# Patient Record
Sex: Male | Born: 1965 | Race: White | Hispanic: No | Marital: Married | Smoking: Never smoker
Health system: Southern US, Community
[De-identification: ages and names within clinical notes are randomized; demographics above are authoritative.]

## PROBLEM LIST (undated history)

## (undated) DIAGNOSIS — G4730 Sleep apnea, unspecified: Secondary | ICD-10-CM

## (undated) DIAGNOSIS — F419 Anxiety disorder, unspecified: Secondary | ICD-10-CM

## (undated) DIAGNOSIS — F1011 Alcohol abuse, in remission: Secondary | ICD-10-CM

## (undated) DIAGNOSIS — E7800 Pure hypercholesterolemia, unspecified: Secondary | ICD-10-CM

## (undated) DIAGNOSIS — E039 Hypothyroidism, unspecified: Secondary | ICD-10-CM

## (undated) DIAGNOSIS — K219 Gastro-esophageal reflux disease without esophagitis: Secondary | ICD-10-CM

## (undated) DIAGNOSIS — M1990 Unspecified osteoarthritis, unspecified site: Secondary | ICD-10-CM

## (undated) DIAGNOSIS — I10 Essential (primary) hypertension: Secondary | ICD-10-CM

## (undated) HISTORY — PX: ABDOMINAL SURGERY: SHX537

## (undated) HISTORY — DX: Hypothyroidism, unspecified: E03.9

## (undated) HISTORY — DX: Alcohol abuse, in remission: F10.11

## (undated) HISTORY — PX: TONGUE BIOPSY: SHX1075

## (undated) HISTORY — PX: ANKLE SURGERY: SHX546

---

## 2007-10-19 HISTORY — PX: OTHER SURGICAL HISTORY: SHX169

## 2007-10-19 HISTORY — PX: COLONOSCOPY: SHX174

## 2008-03-21 HISTORY — PX: COLONOSCOPY WITH ESOPHAGOGASTRODUODENOSCOPY (EGD): SHX5779

## 2008-04-17 HISTORY — PX: OTHER SURGICAL HISTORY: SHX169

## 2013-02-25 ENCOUNTER — Emergency Department (HOSPITAL_COMMUNITY): Payer: BC Managed Care – PPO

## 2013-02-25 ENCOUNTER — Emergency Department (HOSPITAL_COMMUNITY)
Admission: EM | Admit: 2013-02-25 | Discharge: 2013-02-25 | Disposition: A | Discharge status: Home / Self Care | Payer: BC Managed Care – PPO | Attending: Emergency Medicine | Admitting: Emergency Medicine

## 2013-02-25 ENCOUNTER — Encounter (HOSPITAL_COMMUNITY): Payer: Self-pay | Admitting: *Deleted

## 2013-02-25 DIAGNOSIS — IMO0002 Reserved for concepts with insufficient information to code with codable children: Secondary | ICD-10-CM | POA: Insufficient documentation

## 2013-02-25 DIAGNOSIS — M25519 Pain in unspecified shoulder: Secondary | ICD-10-CM | POA: Insufficient documentation

## 2013-02-25 DIAGNOSIS — M792 Neuralgia and neuritis, unspecified: Secondary | ICD-10-CM

## 2013-02-25 DIAGNOSIS — Z79899 Other long term (current) drug therapy: Secondary | ICD-10-CM | POA: Insufficient documentation

## 2013-02-25 DIAGNOSIS — E7800 Pure hypercholesterolemia, unspecified: Secondary | ICD-10-CM | POA: Insufficient documentation

## 2013-02-25 DIAGNOSIS — M5412 Radiculopathy, cervical region: Secondary | ICD-10-CM | POA: Insufficient documentation

## 2013-02-25 DIAGNOSIS — Z7982 Long term (current) use of aspirin: Secondary | ICD-10-CM | POA: Insufficient documentation

## 2013-02-25 HISTORY — DX: Pure hypercholesterolemia, unspecified: E78.00

## 2013-02-25 IMAGING — CR DG CERVICAL SPINE COMPLETE 4+V
6 series · 6 of 6 positions shown · non-contrast
Comparison: None.

CLINICAL DATA: Right-sided arm numbness for 3 days.  No injury.

CERVICAL SPINE - COMPLETE 4+ VIEW

[view not recorded (1 of 6)]
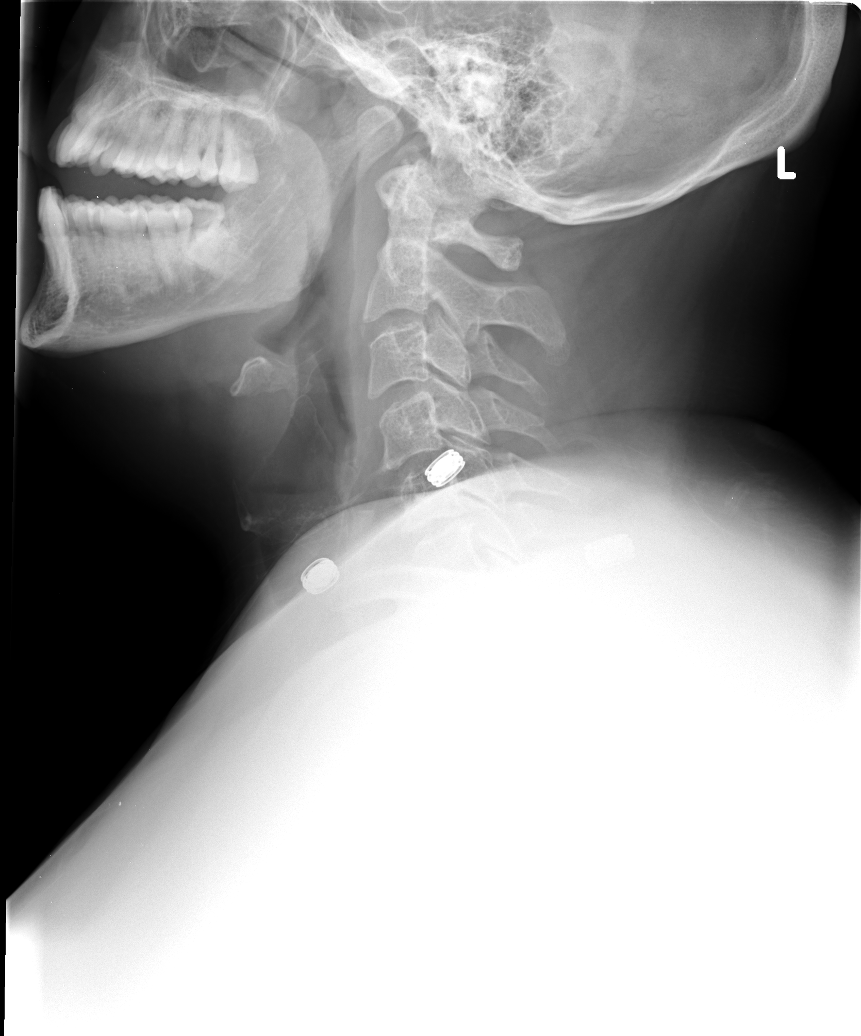

[view not recorded (2 of 6)]
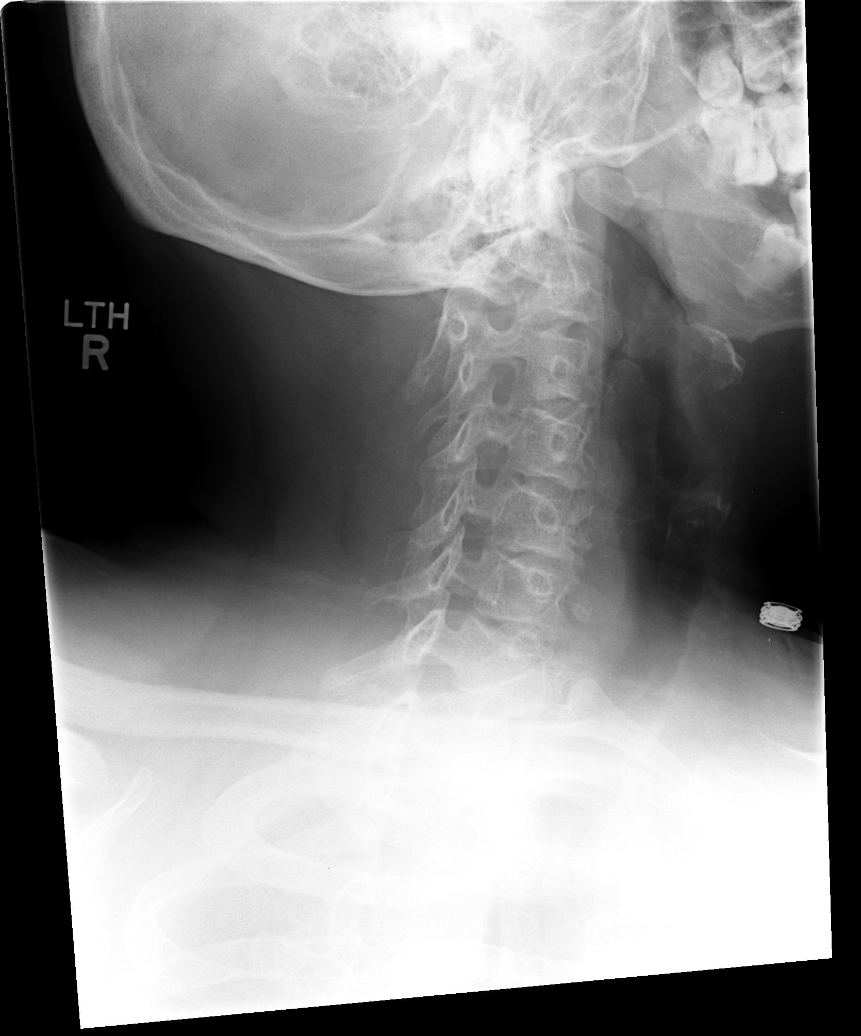

[view not recorded (3 of 6)]
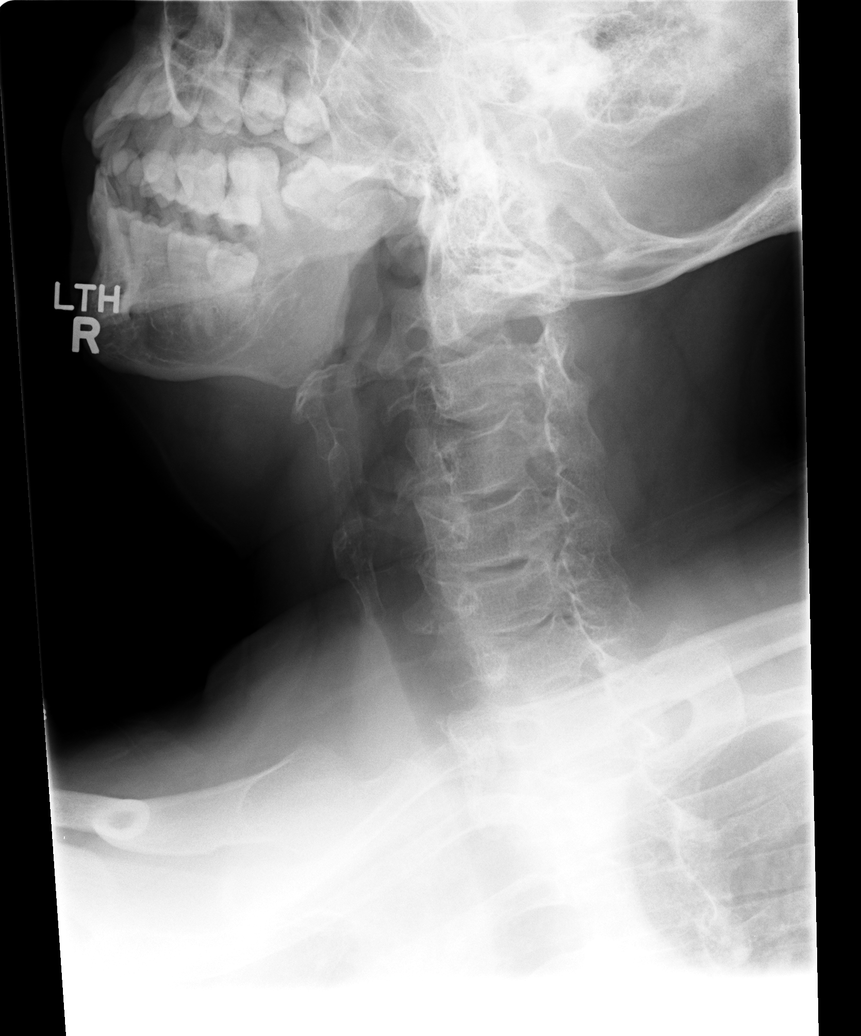

[view not recorded (4 of 6)]
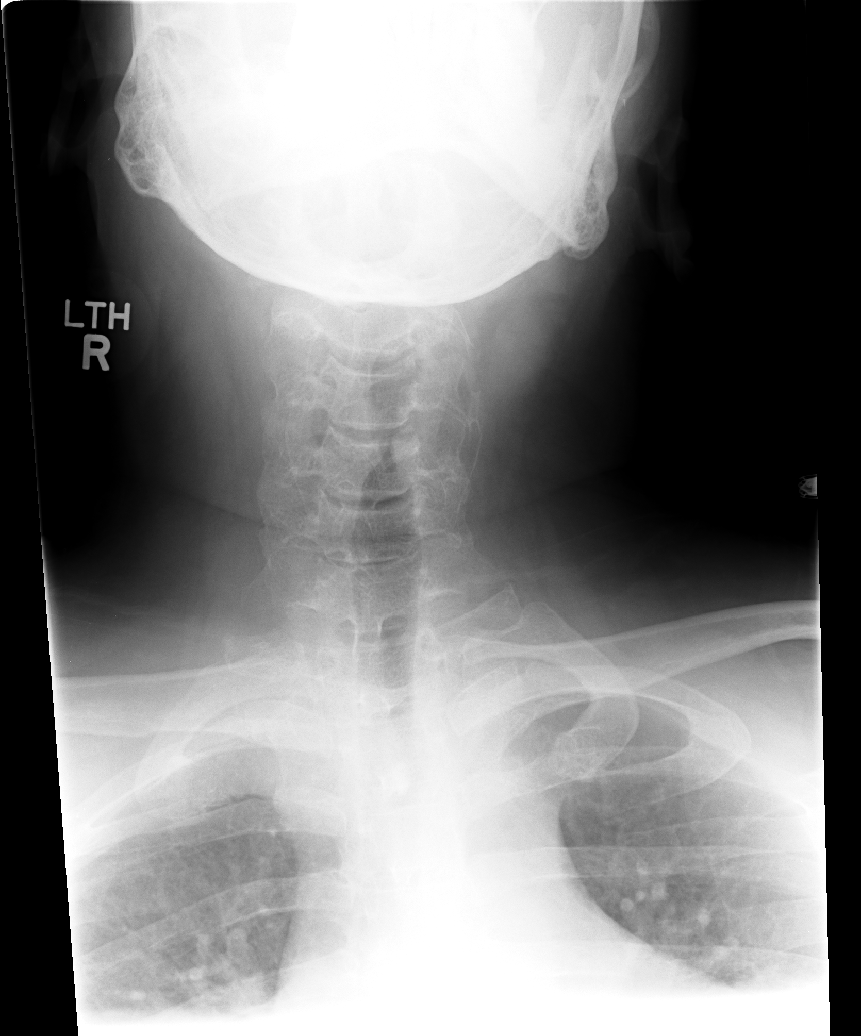

[view not recorded (5 of 6)]
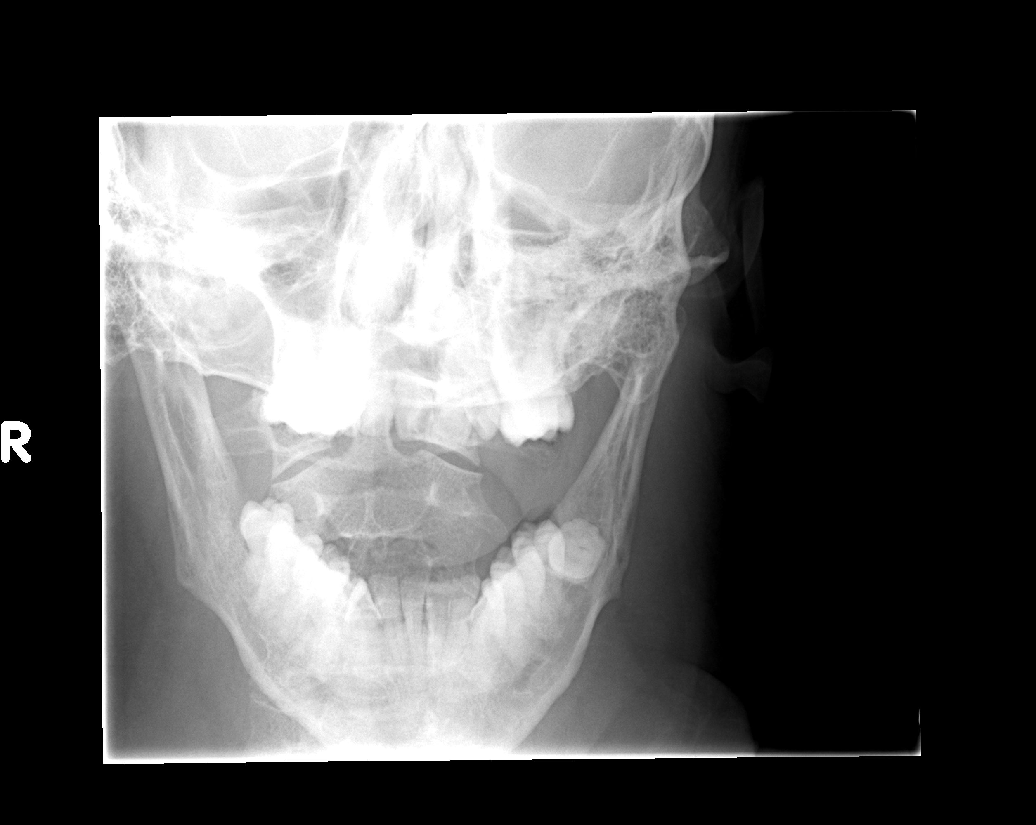

[view not recorded (6 of 6)]
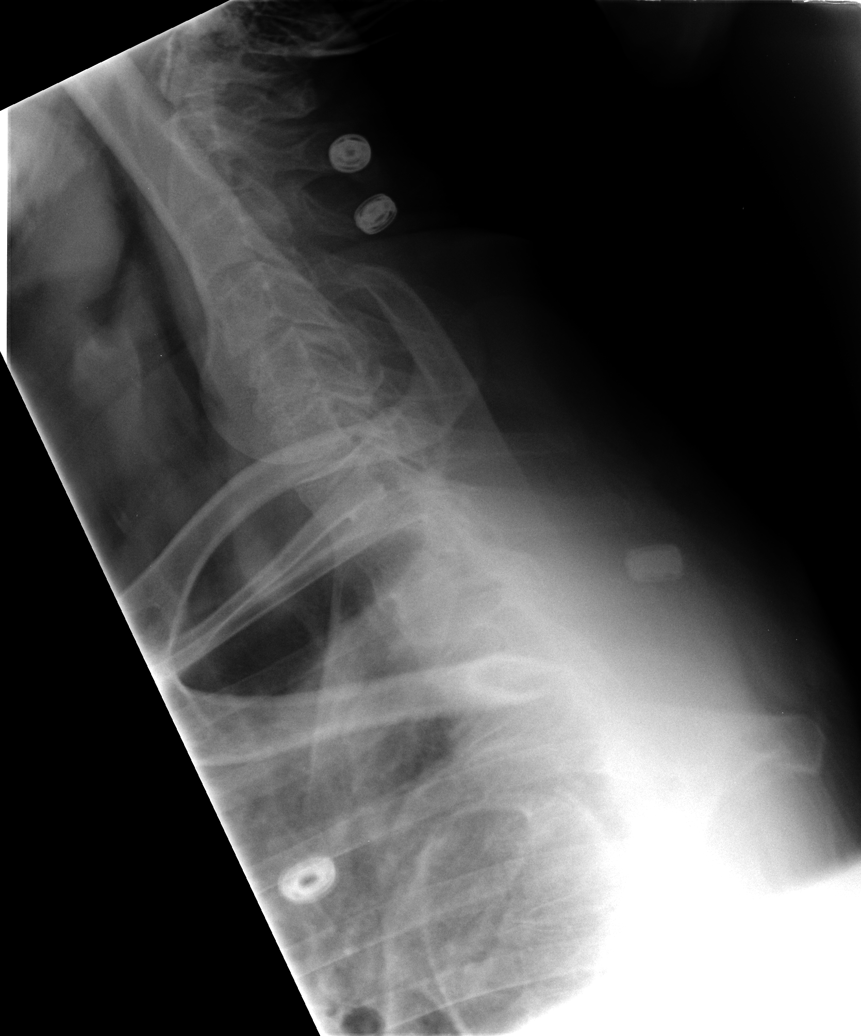

[6 of 6 positions shown; findings below may reference images not displayed]

FINDINGS: Suboptimal visualization of the cervical spine due to
body habitus.  On the lateral view, cervical spine is only
visualized C5-C6.

T5-C6 shows disc space narrowing with posterior osteophytic
spurring.  Prevertebral soft tissues grossly appear within normal
limits.

There is no fracture.  Suboptimal oblique view the left neural
foramina.  The odontoid appears intact.  No fractures identified on
the frontal view.  On the lateral swimmer's view, the cervical
spine is visualized to the cervicothoracic junction and the
alignment appears within normal limits.  On the open-mouth odontoid
view, the head is rotated to the left.
IMPRESSION: C5-C6 predominant cervical spondylosis.  No acute osseous
abnormality.

## 2013-02-25 MED ORDER — OXYCODONE-ACETAMINOPHEN 5-325 MG PO TABS
1.0000 | ORAL_TABLET | Freq: Once | ORAL | Status: AC
  Administered 2013-02-25: 1 via ORAL
  Filled 2013-02-25: qty 1

## 2013-02-25 MED ORDER — HYDROCODONE-ACETAMINOPHEN 5-325 MG PO TABS: 1.0000 | ORAL_TABLET | Freq: Four times a day (QID) | ORAL | Status: DC | PRN

## 2013-02-25 MED ORDER — PREDNISONE 10 MG PO TABS: 20.0000 mg | ORAL_TABLET | Freq: Every day | ORAL | Status: DC

## 2013-02-25 NOTE — ED Notes (Addendum)
Pt c/o pain that starts at neck area and radiates down right arm, numbness and tingling to right hand and fingers, reports that he has weakness to right arm and hand when he tries to pick things up and use that arm, pain and tingling is worse with certain head positions. Symptoms started suddenly Friday, denies any injury.

## 2013-02-25 NOTE — ED Provider Notes (Signed)
History     CSN: [id number]  Arrival date & time 02/25/13  1559   First MD Initiated Contact with Patient 02/25/13 1655      Chief Complaint  Patient presents with  . Numbness    (Consider location/radiation/quality/duration/timing/severity/associated sxs/prior treatment) Patient is a 47 y.o. male presenting with shoulder pain. The history is provided by the patient (pt complains of neck pain radiating down right arm).  Shoulder Pain This is a new problem. The current episode started more than 2 days ago. The problem occurs constantly. The problem has not changed since onset.Pertinent negatives include no chest pain, no abdominal pain and no headaches. Nothing aggravates the symptoms. Nothing relieves the symptoms.    Past Medical History  Diagnosis Date  . High cholesterol     Past Surgical History  Procedure Laterality Date  . Abdominal surgery    . Ankle surgery      No family history on file.  History  Substance Use Topics  . Smoking status: Never Smoker   . Smokeless tobacco: Not on file  . Alcohol Use: Yes      Review of Systems  Constitutional: Negative for appetite change and fatigue.  HENT: Negative for congestion, sinus pressure and ear discharge.   Eyes: Negative for discharge.  Respiratory: Negative for cough.   Cardiovascular: Negative for chest pain.  Gastrointestinal: Negative for abdominal pain and diarrhea.  Genitourinary: Negative for frequency and hematuria.  Musculoskeletal: Negative for back pain.       Pain right arm  Skin: Negative for rash.  Neurological: Negative for seizures and headaches.  Psychiatric/Behavioral: Negative for hallucinations.    Allergies  Review of patient's allergies indicates no known allergies.  Home Medications   Current Outpatient Rx  Name  Route  Sig  Dispense  Refill  . Armodafinil (NUVIGIL) 150 MG tablet   Oral   Take 150 mg by mouth daily.         Marland Kitchen aspirin EC 81 MG tablet   Oral   Take 81 mg  by mouth daily.         . fenofibrate 160 MG tablet   Oral   Take 160 mg by mouth daily.         . fish oil-omega-3 fatty acids 1000 MG capsule   Oral   Take 2 g by mouth 2 (two) times daily.         . fluticasone (FLONASE) 50 MCG/ACT nasal spray   Nasal   Place 2 sprays into the nose daily.         . multivitamin (ONE-A-DAY MEN'S) TABS   Oral   Take 1 tablet by mouth daily.         . pravastatin (PRAVACHOL) 40 MG tablet   Oral   Take 80 mg by mouth daily.         . sertraline (ZOLOFT) 50 MG tablet   Oral   Take 50 mg by mouth daily.         Marland Kitchen HYDROcodone-acetaminophen (NORCO/VICODIN) 5-325 MG per tablet   Oral   Take 1 tablet by mouth every 6 (six) hours as needed for pain.   20 tablet   0   . predniSONE (DELTASONE) 10 MG tablet   Oral   Take 2 tablets (20 mg total) by mouth daily.   14 tablet   0     BP 124/84  Pulse 82  Temp(Src) 98.1 F (36.7 C) (Oral)  Resp 16  Ht 5'  6" (1.676 m)  Wt 236 lb (107.049 kg)  BMI 38.11 kg/m2  SpO2 96%  Physical Exam  Constitutional: He is oriented to person, place, and time. He appears well-developed.  HENT:  Head: Normocephalic.  Eyes: Conjunctivae and EOM are normal. No scleral icterus.  Neck: Neck supple. No thyromegaly present.  Cardiovascular: Normal rate and regular rhythm.  Exam reveals no gallop and no friction rub.   No murmur heard. Pulmonary/Chest: No stridor. He has no wheezes. He has no rales. He exhibits no tenderness.  Abdominal: He exhibits no distension. There is no tenderness. There is no rebound.  Musculoskeletal: Normal range of motion. He exhibits no edema.  Lymphadenopathy:    He has no cervical adenopathy.  Neurological: He is oriented to person, place, and time. No cranial nerve deficit. Coordination normal.  Reflexes normal.    Skin: No rash noted. No erythema.  Psychiatric: He has a normal mood and affect. His behavior is normal.    ED Course  Procedures (including critical  care time)  Labs Reviewed - No data to display Dg Cervical Spine Complete  02/25/2013  *RADIOLOGY REPORT*  Clinical Data: Right-sided arm numbness for 3 days.  No injury.  CERVICAL SPINE - COMPLETE 4+ VIEW  Comparison: None.  Findings: Suboptimal visualization of the cervical spine due to body habitus.  On the lateral view, cervical spine is only visualized C5-C6.  T5-C6 shows disc space narrowing with posterior osteophytic spurring.  Prevertebral soft tissues grossly appear within normal limits.  There is no fracture.  Suboptimal oblique view the left neural foramina.  The odontoid appears intact.  No fractures identified on the frontal view.  On the lateral swimmer's view, the cervical spine is visualized to the cervicothoracic junction and the alignment appears within normal limits.  On the open-mouth odontoid view, the head is rotated to the left.  IMPRESSION: C5-C6 predominant cervical spondylosis.  No acute osseous abnormality.   Original Report Authenticated By: Andreas Newport, M.D.      1. Radicular pain in right arm       MDM          Benny Lennert, MD 02/25/13 1842

## 2016-09-03 ENCOUNTER — Encounter (HOSPITAL_COMMUNITY): Payer: Self-pay

## 2016-09-03 ENCOUNTER — Emergency Department (HOSPITAL_COMMUNITY)
Admission: EM | Admit: 2016-09-03 | Discharge: 2016-09-04 | Disposition: A | Discharge status: Home / Self Care | Payer: BLUE CROSS/BLUE SHIELD | Attending: Emergency Medicine | Admitting: Emergency Medicine

## 2016-09-03 DIAGNOSIS — R531 Weakness: Secondary | ICD-10-CM | POA: Diagnosis present

## 2016-09-03 DIAGNOSIS — R1084 Generalized abdominal pain: Secondary | ICD-10-CM | POA: Insufficient documentation

## 2016-09-03 DIAGNOSIS — Z7952 Long term (current) use of systemic steroids: Secondary | ICD-10-CM | POA: Diagnosis not present

## 2016-09-03 DIAGNOSIS — R197 Diarrhea, unspecified: Secondary | ICD-10-CM | POA: Insufficient documentation

## 2016-09-03 DIAGNOSIS — Z7982 Long term (current) use of aspirin: Secondary | ICD-10-CM | POA: Diagnosis not present

## 2016-09-03 DIAGNOSIS — R5383 Other fatigue: Secondary | ICD-10-CM | POA: Diagnosis not present

## 2016-09-03 DIAGNOSIS — Z79899 Other long term (current) drug therapy: Secondary | ICD-10-CM | POA: Diagnosis not present

## 2016-09-03 DIAGNOSIS — I10 Essential (primary) hypertension: Secondary | ICD-10-CM | POA: Insufficient documentation

## 2016-09-03 DIAGNOSIS — R110 Nausea: Secondary | ICD-10-CM | POA: Diagnosis not present

## 2016-09-03 HISTORY — DX: Sleep apnea, unspecified: G47.30

## 2016-09-03 HISTORY — DX: Essential (primary) hypertension: I10

## 2016-09-03 LAB — URINALYSIS, ROUTINE W REFLEX MICROSCOPIC
Bilirubin Urine: NEGATIVE
Glucose, UA: NEGATIVE mg/dL
Hgb urine dipstick: NEGATIVE
Ketones, ur: NEGATIVE mg/dL
LEUKOCYTES UA: NEGATIVE
NITRITE: NEGATIVE
PH: 6 (ref 5.0–8.0)
Protein, ur: NEGATIVE mg/dL

## 2016-09-03 LAB — COMPREHENSIVE METABOLIC PANEL
ALBUMIN: 3.9 g/dL (ref 3.5–5.0)
ALT: 62 U/L (ref 17–63)
ANION GAP: 8 (ref 5–15)
AST: 65 U/L — ABNORMAL HIGH (ref 15–41)
Alkaline Phosphatase: 51 U/L (ref 38–126)
BILIRUBIN TOTAL: 0.5 mg/dL (ref 0.3–1.2)
BUN: 5 mg/dL — ABNORMAL LOW (ref 6–20)
CO2: 26 mmol/L (ref 22–32)
Calcium: 9.6 mg/dL (ref 8.9–10.3)
Chloride: 100 mmol/L — ABNORMAL LOW (ref 101–111)
Creatinine, Ser: 0.99 mg/dL (ref 0.61–1.24)
GFR calc Af Amer: >60 mL/min (ref >60)
GFR calc non Af Amer: >60 mL/min (ref >60)
GLUCOSE: 92 mg/dL (ref 65–99)
POTASSIUM: 3.9 mmol/L (ref 3.5–5.1)
SODIUM: 134 mmol/L — AB (ref 135–145)
TOTAL PROTEIN: 7.7 g/dL (ref 6.5–8.1)

## 2016-09-03 LAB — CBC WITH DIFFERENTIAL/PLATELET
BASOS ABS: 0 10*3/uL (ref 0.0–0.1)
BASOS PCT: 0 %
Eosinophils Absolute: 0.2 10*3/uL (ref 0.0–0.7)
Eosinophils Relative: 2 %
HEMATOCRIT: 41.9 % (ref 39.0–52.0)
HEMOGLOBIN: 14.1 g/dL (ref 13.0–17.0)
Lymphocytes Relative: 18 %
Lymphs Abs: 2.3 10*3/uL (ref 0.7–4.0)
MCH: 28.8 pg (ref 26.0–34.0)
MCHC: 33.7 g/dL (ref 30.0–36.0)
MCV: 85.5 fL (ref 78.0–100.0)
MONOS PCT: 6 %
Monocytes Absolute: 0.8 10*3/uL (ref 0.1–1.0)
NEUTROS ABS: 9.7 10*3/uL — AB (ref 1.7–7.7)
NEUTROS PCT: 74 %
Platelets: 488 10*3/uL — ABNORMAL HIGH (ref 150–400)
RBC: 4.9 MIL/uL (ref 4.22–5.81)
RDW: 13.5 % (ref 11.5–15.5)
WBC: 13 10*3/uL — ABNORMAL HIGH (ref 4.0–10.5)

## 2016-09-03 LAB — I-STAT CG4 LACTIC ACID, ED: Lactic Acid, Venous: 1.22 mmol/L (ref 0.5–1.9)

## 2016-09-03 LAB — LIPASE, BLOOD: Lipase: 25 U/L (ref 11–51)

## 2016-09-03 MED ORDER — SODIUM CHLORIDE 0.9 % IV BOLUS (SEPSIS)
1000.0000 mL | Freq: Once | INTRAVENOUS | Status: AC
  Administered 2016-09-04: 1000 mL via INTRAVENOUS

## 2016-09-03 MED ORDER — ONDANSETRON HCL 4 MG/2ML IJ SOLN
4.0000 mg | Freq: Once | INTRAMUSCULAR | Status: AC
  Administered 2016-09-04: 4 mg via INTRAVENOUS
  Filled 2016-09-03: qty 2

## 2016-09-03 MED ORDER — SODIUM CHLORIDE 0.9 % IV BOLUS (SEPSIS)
1000.0000 mL | Freq: Once | INTRAVENOUS | Status: AC
  Administered 2016-09-03: 1000 mL via INTRAVENOUS

## 2016-09-03 NOTE — ED Notes (Signed)
Spouse states that pt has not had a drink since January "when he got his license back".
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
## 2016-09-03 NOTE — ED Notes (Signed)
Physician in to assess 

## 2016-09-03 NOTE — ED Notes (Signed)
  Pt states he has had D for the last 2 weeks- except last night when his stools were "mushy" per his report.He has a physician in [place] from where he traveled- Dr Jon Gillsodriquez who he has not seen. He reports 2 loose stools today- and last urinated giving a UA

## 2016-09-03 NOTE — ED Notes (Signed)
Pt reports that he has loose stools after he eats-

## 2016-09-03 NOTE — ED Notes (Signed)
Sprite to pt for pos

## 2016-09-03 NOTE — ED Triage Notes (Signed)
I have diarrhea, started two weeks ago.  Feeling real weak, having stomach cramps, and my head keeps hurting.  I was having nausea and vomiting, when it first started, now just having nausea.  Has lost a lot of weight (17 lbs) in the past two weeks.  Patient is able to eat and drink, but after he eats he has diarrhea.

## 2016-09-04 ENCOUNTER — Emergency Department (HOSPITAL_COMMUNITY)
Admission: EM | Admit: 2016-09-04 | Discharge: 2016-09-05 | Disposition: A | Discharge status: Home / Self Care | Payer: BLUE CROSS/BLUE SHIELD | Source: Home / Self Care | Attending: Emergency Medicine | Admitting: Emergency Medicine

## 2016-09-04 ENCOUNTER — Emergency Department (HOSPITAL_COMMUNITY): Payer: BLUE CROSS/BLUE SHIELD

## 2016-09-04 ENCOUNTER — Encounter (HOSPITAL_COMMUNITY): Payer: Self-pay | Admitting: Emergency Medicine

## 2016-09-04 DIAGNOSIS — Z7982 Long term (current) use of aspirin: Secondary | ICD-10-CM | POA: Insufficient documentation

## 2016-09-04 DIAGNOSIS — R197 Diarrhea, unspecified: Secondary | ICD-10-CM

## 2016-09-04 DIAGNOSIS — R112 Nausea with vomiting, unspecified: Secondary | ICD-10-CM | POA: Insufficient documentation

## 2016-09-04 DIAGNOSIS — I10 Essential (primary) hypertension: Secondary | ICD-10-CM

## 2016-09-04 DIAGNOSIS — R531 Weakness: Secondary | ICD-10-CM

## 2016-09-04 DIAGNOSIS — R109 Unspecified abdominal pain: Secondary | ICD-10-CM | POA: Insufficient documentation

## 2016-09-04 DIAGNOSIS — Z79899 Other long term (current) drug therapy: Secondary | ICD-10-CM

## 2016-09-04 IMAGING — CT CT ABD-PELV W/ CM
2 of 5 series · 17 of 46 positions shown, 19 images · IV contrast (APPLIED)
Comparison: None.

CLINICAL DATA: Constant diarrhea for 2 weeks

EXAM:
CT ABDOMEN AND PELVIS WITH CONTRAST
TECHNIQUE: Multidetector CT imaging of the abdomen and pelvis was performed
using the standard protocol following bolus administration of
intravenous contrast.
CONTRAST:  100 mL [74] intravenous. Enteric contrast was also
administered.

[Series 2: axial st · axial · 0.98mm/px · z∈[-852,-412]mm · 14 of 100 slices shown, 16 images]
[im 6/100  soft-tissue]
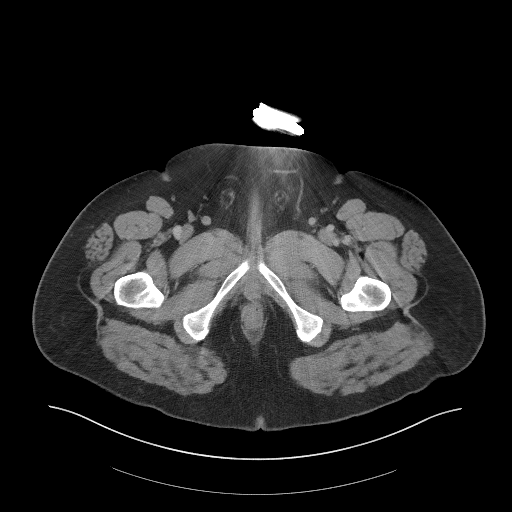
[im 6/100  bone]
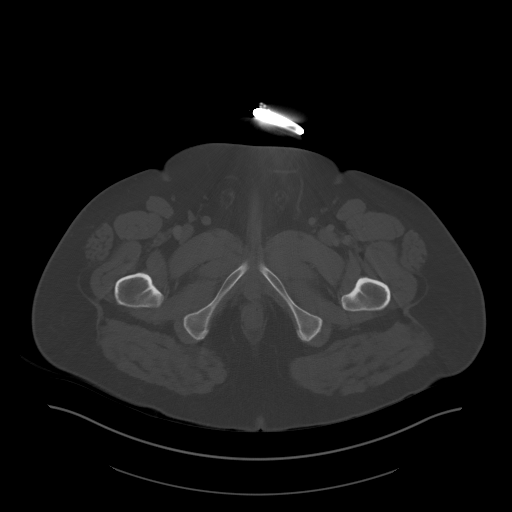
[im 12/100  soft-tissue]
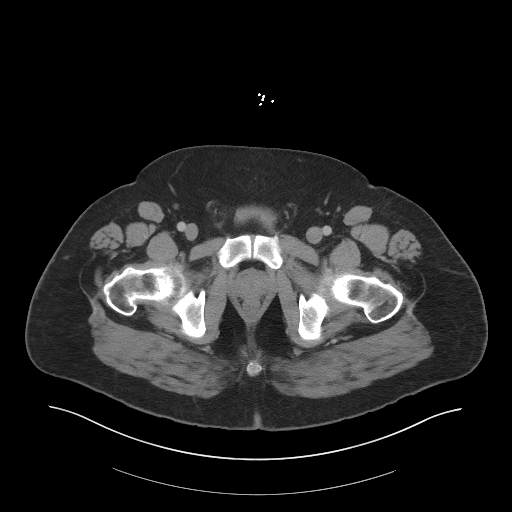
[im 18/100  soft-tissue]
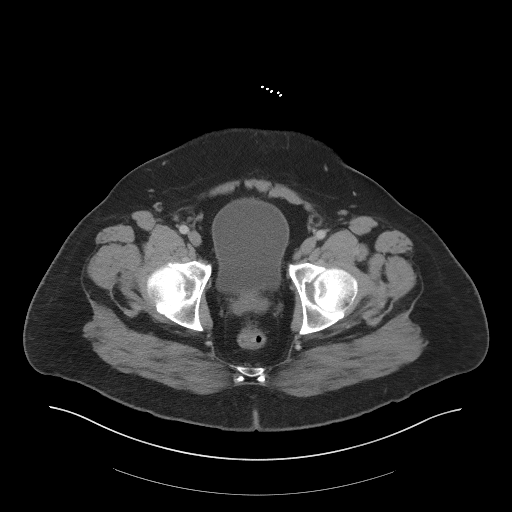
[im 30/100  soft-tissue]
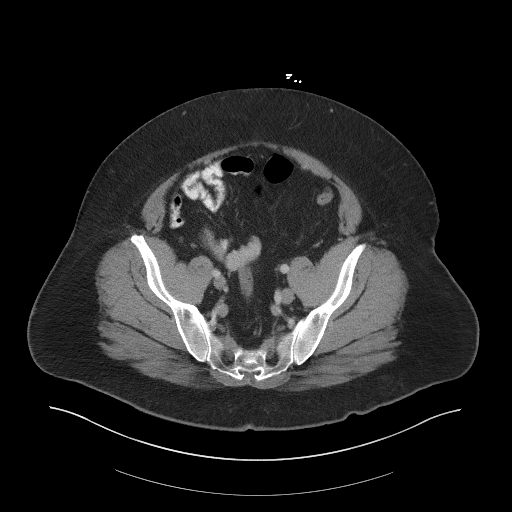
[im 35/100  soft-tissue]
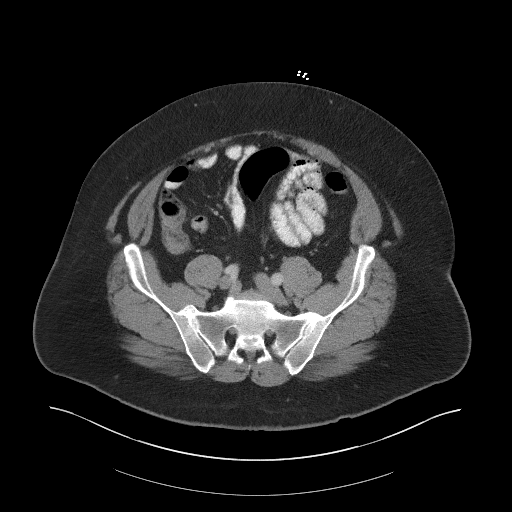
[im 41/100  soft-tissue]
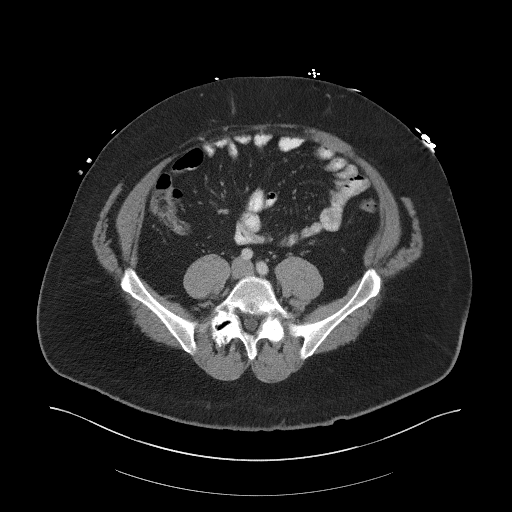
[im 47/100  soft-tissue]
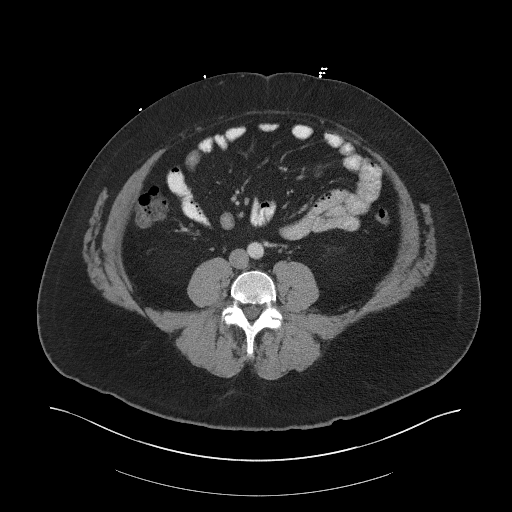
[im 53/100  soft-tissue]
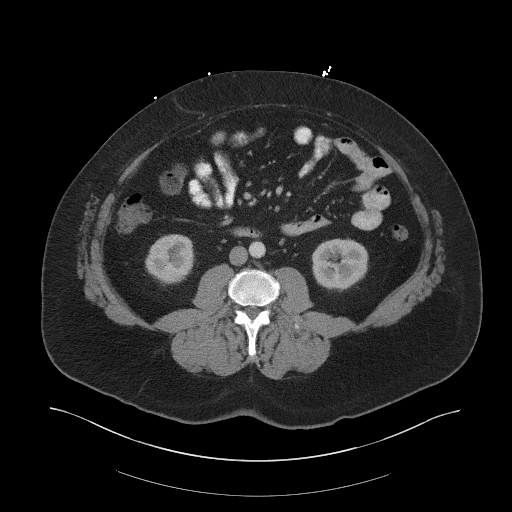
[im 59/100  soft-tissue]
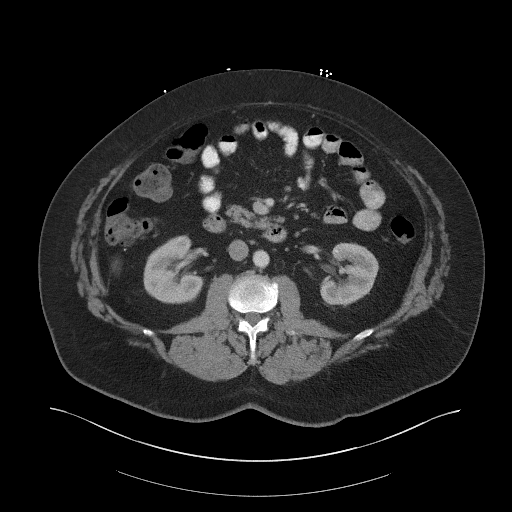
[im 59/100  bone]
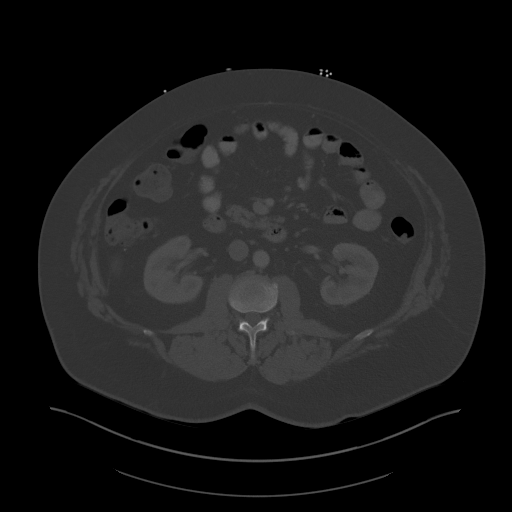
[im 65/100  soft-tissue]
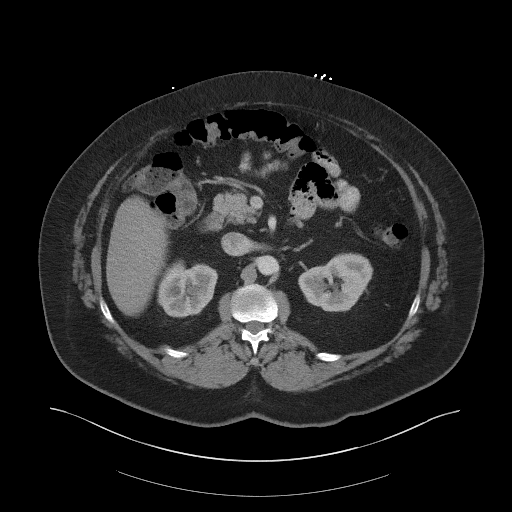
[im 76/100  soft-tissue]
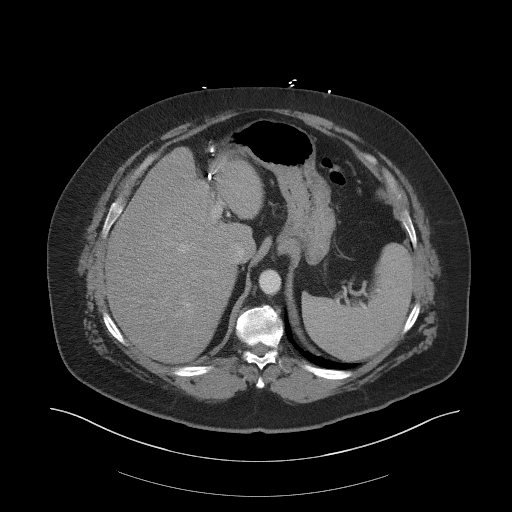
[im 82/100  soft-tissue]
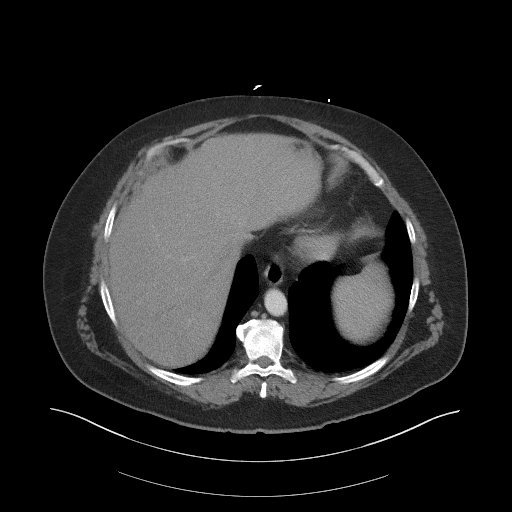
[im 88/100  soft-tissue]
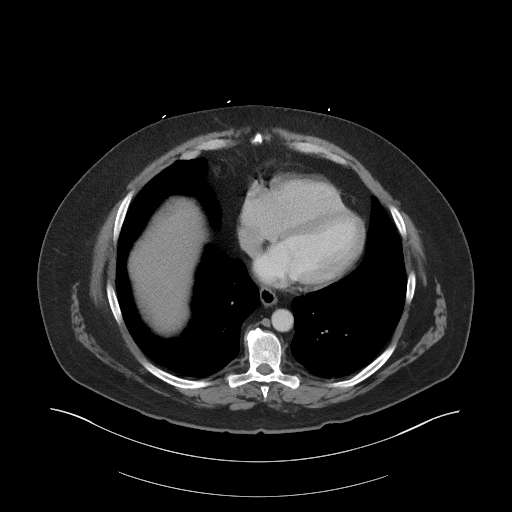
[im 94/100  soft-tissue]
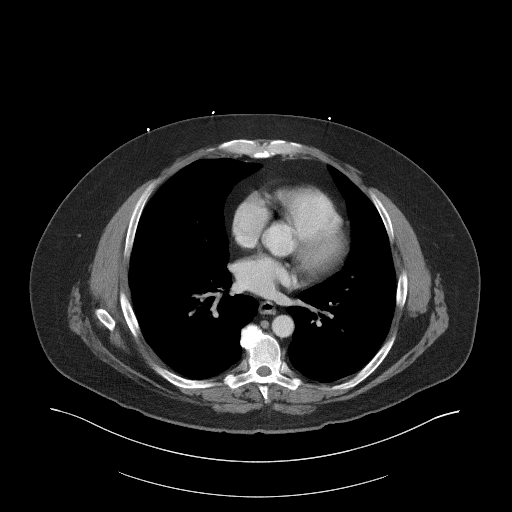

[Series 4: coronal st · coronal · 0.92mm/px · 3 of 129 slices shown]
[im 43/129  soft-tissue]
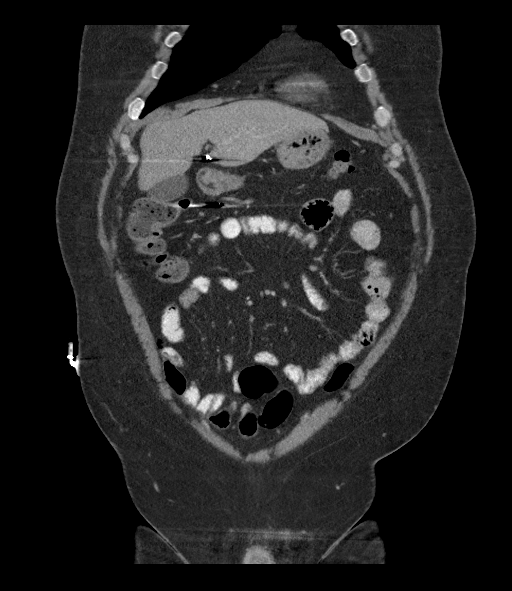
[im 57/129  soft-tissue]
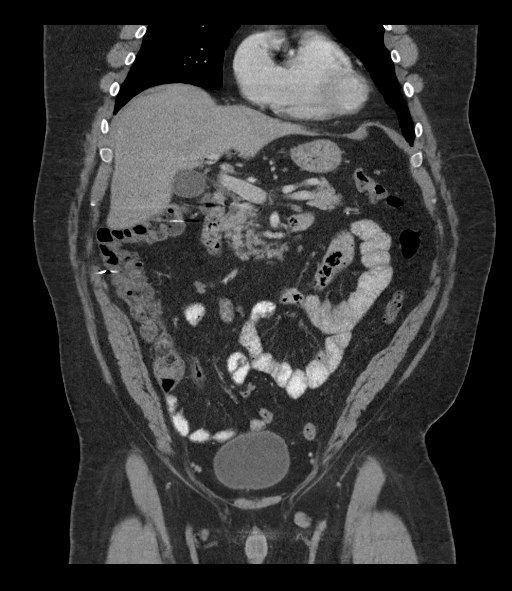
[im 72/129  soft-tissue]
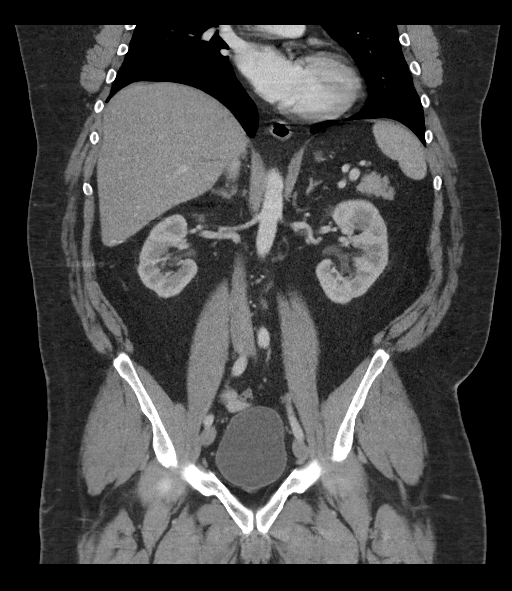

[17 of 46 positions shown; findings below may reference images not displayed]

FINDINGS: Lower chest: No acute abnormality.

Hepatobiliary: No focal liver abnormality is seen. No gallstones,
gallbladder wall thickening, or biliary dilatation.

Pancreas: Unremarkable. No pancreatic ductal dilatation or
surrounding inflammatory changes.

Spleen: Normal in size without focal abnormality.

Adrenals/Urinary Tract: Adrenal glands are unremarkable. Kidneys are
normal, without renal calculi, focal lesion, or hydronephrosis.
Bladder is unremarkable.

Stomach/Bowel: Stomach is within normal limits. Appendix appears
normal. No evidence of bowel wall thickening, distention, or
inflammatory changes.

Vascular/Lymphatic: No significant vascular findings are present. No
enlarged abdominal or pelvic lymph nodes.

Reproductive: Prostate is unremarkable.

Other: No inflammatory changes are evident in the abdomen or pelvis.
There is no ascites.

Musculoskeletal: No acute or significant osseous findings.
IMPRESSION: No significant abnormality.

## 2016-09-04 MED ORDER — IOPAMIDOL (ISOVUE-300) INJECTION 61%: 100.0000 mL | Freq: Once | INTRAVENOUS | Status: DC | PRN

## 2016-09-04 MED ORDER — IOPAMIDOL (ISOVUE-300) INJECTION 61%
INTRAVENOUS | Status: AC
  Filled 2016-09-04: qty 30

## 2016-09-04 MED ORDER — METRONIDAZOLE 500 MG PO TABS
500.0000 mg | ORAL_TABLET | Freq: Once | ORAL | Status: AC
Start: 2016-09-04 — End: 2016-09-04
  Administered 2016-09-04: 500 mg via ORAL
  Filled 2016-09-04: qty 1

## 2016-09-04 MED ORDER — SODIUM CHLORIDE 0.9 % IV BOLUS (SEPSIS)
1000.0000 mL | Freq: Once | INTRAVENOUS | Status: AC
  Administered 2016-09-04: 1000 mL via INTRAVENOUS

## 2016-09-04 MED ORDER — ONDANSETRON HCL 4 MG PO TABS: 4.0000 mg | ORAL_TABLET | Freq: Four times a day (QID) | ORAL | 0 refills | Status: DC

## 2016-09-04 MED ORDER — METRONIDAZOLE 500 MG PO TABS: 500.0000 mg | ORAL_TABLET | Freq: Three times a day (TID) | ORAL | 0 refills | Status: DC

## 2016-09-04 NOTE — ED Notes (Signed)
Pt reported to the physician that he has been on antibiotics for one week after a callus removal from the podiatrist

## 2016-09-04 NOTE — ED Notes (Signed)
Physician in to reassess 

## 2016-09-04 NOTE — Discharge Instructions (Signed)
You will be started antibiotics. Follow-up with your primary physician and GI doctor above if symptoms persist. Stool studies are pending. Continue to hydrate aggressively at home.

## 2016-09-04 NOTE — ED Notes (Signed)
Drinking contrast.

## 2016-09-04 NOTE — ED Notes (Signed)
Pt out of bed to BR where he "passed gas".

## 2016-09-04 NOTE — Discharge Instructions (Signed)
Your testing is reassuring.  Keep yourself hydrated. Followup with Dr. Sherlon Handingodriguez or Dr. Darrick PennaFields for stool testing. Return to the ED if you develop new or worsening symptoms.

## 2016-09-04 NOTE — ED Notes (Signed)
Patient transported to CT 

## 2016-09-04 NOTE — ED Provider Notes (Signed)
[facility]-EM[facility]Y DEPT Provider Note   CSN: [id number] Arrival date & time: 09/04/16  2226  By signing my name below, I, Clovis PuAvnee Patel, attest that this documentation has been prepared under the direction and in the presence of Shon Batonourtney F Horton, MD  Electronically Signed: Clovis PuAvnee Patel, ED Scribe. 09/04/16. 11:33 PM.   History   Chief Complaint Chief Complaint  Patient presents with  . Diarrhea   The history is provided by the patient. No language interpreter was used.   HPI Comments:  Jeremiah Zamora is a 50 y.o. male, with a hx of HTN and hyperlipidemia, who presents to the Emergency Department complaining of intermittent episodes of persistent diarrhea x 2 weeks. He states he has had 7 episodes of diarrhea today. Pt notes associated cramping upper abdominal pain, nausea, weakness and resolved vomiting. He states he is able to eat but he immediately has a bowel movent following every meal. Pt states he has taken imodium with no relief, quite yesterday at direction of Dr. Manus Gunningancour because he had been taking too much. Reports recent antibiotic use, Keflex.  Pt denies fevers, hematochezia, any other associated symptoms and modifying factors at this time. Pt was seen in the ED yesterday for the same symptoms.   Past Medical History:  Diagnosis Date  . High cholesterol   . Hypertension   . Sleep apnea     There are no active problems to display for this patient.   Past Surgical History:  Procedure Laterality Date  . ABDOMINAL SURGERY    . ANKLE SURGERY         Home Medications    Prior to Admission medications   Medication Sig Start Date End Date Taking? Authorizing Provider  Armodafinil (NUVIGIL) 150 MG tablet Take 150 mg by mouth daily.    Historical Provider, MD  aspirin EC 81 MG tablet Take 81 mg by mouth daily.    Historical Provider, MD  fenofibrate 160 MG tablet Take 160 mg by mouth daily.    Historical Provider, MD  fish oil-omega-3 fatty acids 1000 MG capsule Take 2 g  by mouth 2 (two) times daily.    Historical Provider, MD  fluticasone (FLONASE) 50 MCG/ACT nasal spray Place 2 sprays into the nose daily.    Historical Provider, MD  HYDROcodone-acetaminophen (NORCO/VICODIN) 5-325 MG per tablet Take 1 tablet by mouth every 6 (six) hours as needed for pain. 02/25/13   Bethann BerkshireJoseph Zammit, MD  multivitamin (ONE-A-DAY MEN'S) TABS Take 1 tablet by mouth daily.    Historical Provider, MD  ondansetron (ZOFRAN) 4 MG tablet Take 1 tablet (4 mg total) by mouth every 6 (six) hours. 09/04/16   Glynn OctaveStephen Rancour, MD  pravastatin (PRAVACHOL) 40 MG tablet Take 80 mg by mouth daily.    Historical Provider, MD  predniSONE (DELTASONE) 10 MG tablet Take 2 tablets (20 mg total) by mouth daily. 02/25/13   Bethann BerkshireJoseph Zammit, MD  sertraline (ZOLOFT) 50 MG tablet Take 50 mg by mouth daily.    Historical Provider, MD    Family History Family History  Problem Relation Age of Onset  . Crohn's disease Mother   . Diabetes Mother   . Heart disease Father     Social History Social History  Substance Use Topics  . Smoking status: Never Smoker  . Smokeless tobacco: Never Used  . Alcohol use Yes     Allergies   Patient has no known allergies.   Review of Systems Review of Systems  Constitutional: Negative for fever.  Respiratory: Negative  for shortness of breath.   Cardiovascular: Negative for chest pain.  Gastrointestinal: Positive for abdominal pain, diarrhea, nausea and vomiting. Negative for blood in stool.  Neurological: Positive for weakness.  All other systems reviewed and are negative.    Physical Exam Updated Vital Signs BP 122/69 (BP Location: Left Arm)   Pulse 85   Temp 97.1 F (36.2 C) (Tympanic)   Resp 20   Ht 5\' 6"  (1.676 m)   Wt 233 lb (105.7 kg)   SpO2 98%   BMI 37.61 kg/m   Physical Exam  Constitutional: He is oriented to person, place, and time. He appears well-developed and well-nourished.  Overweight  HENT:  Head: Normocephalic and atraumatic.    Mucous membranes moist  Cardiovascular: Normal rate, regular rhythm and normal heart sounds.   No murmur heard. Pulmonary/Chest: Effort normal and breath sounds normal. No respiratory distress. He has no wheezes.  Abdominal: Soft. Bowel sounds are normal. There is no tenderness. There is no rebound.  Normal bowel sounds, no objective tenderness  Neurological: He is alert and oriented to person, place, and time.  Skin: Skin is warm and dry.  Psychiatric: He has a normal mood and affect.  Nursing note and vitals reviewed.    ED Treatments / Results  DIAGNOSTIC STUDIES:  Oxygen Saturation is 98% on RA, normal by my interpretation.    COORDINATION OF CARE:  11:19 PM Discussed treatment plan with pt at bedside and pt agreed to plan.  Labs (all labs ordered are listed, but only abnormal results are displayed) Labs Reviewed  GASTROINTESTINAL PANEL BY PCR, STOOL (REPLACES STOOL CULTURE)  C DIFFICILE QUICK SCREEN W PCR REFLEX    EKG  EKG Interpretation None       Radiology Ct Abdomen Pelvis W Contrast  Result Date: 09/04/2016 CLINICAL DATA:  Constant diarrhea for 2 weeks EXAM: CT ABDOMEN AND PELVIS WITH CONTRAST TECHNIQUE: Multidetector CT imaging of the abdomen and pelvis was performed using the standard protocol following bolus administration of intravenous contrast. CONTRAST:  100 mL Isovue-300 intravenous. Enteric contrast was also administered. COMPARISON:  None. FINDINGS: Lower chest: No acute abnormality. Hepatobiliary: No focal liver abnormality is seen. No gallstones, gallbladder wall thickening, or biliary dilatation. Pancreas: Unremarkable. No pancreatic ductal dilatation or surrounding inflammatory changes. Spleen: Normal in size without focal abnormality. Adrenals/Urinary Tract: Adrenal glands are unremarkable. Kidneys are normal, without renal calculi, focal lesion, or hydronephrosis. Bladder is unremarkable. Stomach/Bowel: Stomach is within normal limits. Appendix  appears normal. No evidence of bowel wall thickening, distention, or inflammatory changes. Vascular/Lymphatic: No significant vascular findings are present. No enlarged abdominal or pelvic lymph nodes. Reproductive: Prostate is unremarkable. Other: No inflammatory changes are evident in the abdomen or pelvis. There is no ascites. Musculoskeletal: No acute or significant osseous findings. IMPRESSION: No significant abnormality. Electronically Signed   By: Ellery Plunkaniel R Mitchell M.D.   On: 09/04/2016 01:27    Procedures Procedures (including critical care time)  Medications Ordered in ED Medications  metroNIDAZOLE (FLAGYL) tablet 500 mg (not administered)     Initial Impression / Assessment and Plan / ED Course  I have reviewed the triage vital signs and the nursing notes.  Pertinent labs & imaging results that were available during my care of the patient were reviewed by me and considered in my medical decision making (see chart for details).  Clinical Course     Patient presents with ongoing diarrhea. Had an extensive workup yesterday including a CT scan which was reassuring. He does report recent  antibiotic use. He was unable to provide a stool sample yesterday; however, he has already provided one today. It does appear very liquidy. No obvious blood. Stool studies sent including C. Difficile.  Giving ongoing symptoms, feel it's reasonable to start Flagyl empirically to see if this patient helps the patient's symptoms. I do not feel he needs further lab or imaging workup today. He may need follow-up with gastroenterology. This will be provided. I have encouraged aggressive hydration.  After history, exam, and medical workup I feel the patient has been appropriately medically screened and is safe for discharge home. Pertinent diagnoses were discussed with the patient. Patient was given return precautions.   Final Clinical Impressions(s) / ED Diagnoses   Final diagnoses:  Diarrhea, unspecified  type    New Prescriptions New Prescriptions   No medications on file   I personally performed the services described in this documentation, which was scribed in my presence. The recorded information has been reviewed and is accurate.     Shon Baton, MD 09/04/16 [phone]

## 2016-09-04 NOTE — ED Notes (Signed)
Pt drinking sprite at this time 

## 2016-09-04 NOTE — ED Triage Notes (Signed)
Pt reports he was seen here last night for diarrhea. Pt states his symptoms started 2 weeks ago. Pt unable to eat or drink without having diarrhea.

## 2016-09-05 LAB — C DIFFICILE QUICK SCREEN W PCR REFLEX
C DIFFICILE (CDIFF) INTERP: NOT DETECTED
C DIFFICILE (CDIFF) TOXIN: NEGATIVE
C DIFFICLE (CDIFF) ANTIGEN: NEGATIVE

## 2016-09-06 LAB — GASTROINTESTINAL PANEL BY PCR, STOOL (REPLACES STOOL CULTURE)

## 2016-09-07 ENCOUNTER — Encounter: Payer: Self-pay | Admitting: Gastroenterology

## 2016-09-16 ENCOUNTER — Ambulatory Visit: Payer: BLUE CROSS/BLUE SHIELD | Admitting: Gastroenterology

## 2016-09-27 ENCOUNTER — Ambulatory Visit: Payer: BLUE CROSS/BLUE SHIELD | Admitting: Gastroenterology

## 2016-10-29 ENCOUNTER — Encounter: Payer: Self-pay | Admitting: Gastroenterology

## 2016-10-29 ENCOUNTER — Ambulatory Visit (INDEPENDENT_AMBULATORY_CARE_PROVIDER_SITE_OTHER): Payer: BLUE CROSS/BLUE SHIELD | Admitting: Gastroenterology

## 2016-10-29 DIAGNOSIS — R197 Diarrhea, unspecified: Secondary | ICD-10-CM

## 2016-10-29 DIAGNOSIS — K219 Gastro-esophageal reflux disease without esophagitis: Secondary | ICD-10-CM

## 2016-10-29 DIAGNOSIS — R7989 Other specified abnormal findings of blood chemistry: Secondary | ICD-10-CM | POA: Diagnosis not present

## 2016-10-29 DIAGNOSIS — R945 Abnormal results of liver function studies: Secondary | ICD-10-CM

## 2016-10-29 MED ORDER — PANTOPRAZOLE SODIUM 40 MG PO TBEC: 40.0000 mg | DELAYED_RELEASE_TABLET | Freq: Every day | ORAL | 3 refills | Status: DC

## 2016-10-29 NOTE — Assessment & Plan Note (Signed)
51 year old gentleman with extensive GI evaluation per his history, records have been requested, who presents for recent acute on chronic diarrhea. Mother has a history of Crohn's disease. Recent acute symptoms have resolved and he is back to baseline loose stools. Cannot rule out possibility of repeat colonoscopy, endoscopy in the future. Await records for review, further recommendations to follow.

## 2016-10-29 NOTE — Patient Instructions (Signed)
1. Start pantoprazole once daily before first meal of the day for indigestion/heartburn. RX sent to your pharmacy.  2. I have requested your records for review. We will touch base with you the first of the week with further recommendations once records are reviewed.

## 2016-10-29 NOTE — Assessment & Plan Note (Signed)
Recent elevation of AST in the setting of acute illness. Recheck in the near future, once other records are reviewed.

## 2016-10-29 NOTE — Assessment & Plan Note (Signed)
Recent onset heartburn/indigestion, 3 months duration. No prior history. Start PPI therapy. Patient reports prior endoscopy although remote. We are requesting further records.
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
## 2016-11-01 NOTE — Progress Notes (Signed)
CC'ED TO PCP 

## 2016-11-05 ENCOUNTER — Encounter: Payer: Self-pay | Admitting: Gastroenterology

## 2016-11-05 NOTE — Progress Notes (Addendum)
Reviewed records from [facility] and from Dr. [facility]hour/[facility] and PCP.  Updated PSH with colonoscopy/EGD/small bowel capsule and small bowel enteroscopy reports.   Patient with abnormal findings of distal TI on small bowel capsule, nothing seen on colonoscopy with terminal ileoscopy and for persistent symptoms seen [facility] for push enteroscopy without evidence to support Crohn's although on bx there was chronic inflammation of terminal ileum and left colon.   Mother with h/o Crohn's.   Patient also with h/o abnormal LFTs: most recently AST 65 and ALT 62 (upper limits of normal) but in 06/2016 AST was 54, ALT 37, HCV Ab negative. Patient reports previous etoh abuse but not recently, quit 10/2015. CT with no liver abnormalities noted.     Please schedule patient for TCS/EGD with [facility] deep sedation for h/o etoh use in past. Patient has sleep apnea, FYI.  Dx: chronic diarrhea with h/o distal small bowel erosions (2009 work up), rectal bleeding, new onset GERD for 3 months in patient greater than 50.   Patient needs further labs for elevated AST. LFTs, iron/tibc, ferritin, tissue transglutamase IgA, IgA level, hep B surface Ag, Hep B core total antibody.

## 2016-11-08 ENCOUNTER — Other Ambulatory Visit: Payer: Self-pay

## 2016-11-08 DIAGNOSIS — K625 Hemorrhage of anus and rectum: Secondary | ICD-10-CM

## 2016-11-08 DIAGNOSIS — R740 Nonspecific elevation of levels of transaminase and lactic acid dehydrogenase [LDH]: Principal | ICD-10-CM

## 2016-11-08 DIAGNOSIS — K219 Gastro-esophageal reflux disease without esophagitis: Secondary | ICD-10-CM

## 2016-11-08 DIAGNOSIS — R7401 Elevation of levels of liver transaminase levels: Secondary | ICD-10-CM

## 2016-11-08 DIAGNOSIS — K529 Noninfective gastroenteritis and colitis, unspecified: Secondary | ICD-10-CM

## 2016-11-08 MED ORDER — PEG 3350-KCL-NA BICARB-NACL 420 G PO SOLR: 4000.0000 mL | ORAL | 0 refills | Status: DC

## 2016-11-08 NOTE — Progress Notes (Signed)
PT is aware to go to the lab for his labs. He is aware Ginger/Martina will call to schedule the TCS/EGD. He said to try to call his house first. IF no answer, call his cell @ [phone] or his wife's cell @ [phone].

## 2016-11-08 NOTE — Progress Notes (Signed)
I called and LMOM for a return call. Lab orders have been entered.

## 2016-11-08 NOTE — Progress Notes (Signed)
Called pt. TCS/EGD w/Propofol with [facility] scheduled for 11/23/16 at 8:45 am. Gave instructions on phone and mailed instructions. Orders entered. Awaiting pre-op appt.

## 2016-11-09 ENCOUNTER — Telehealth: Payer: Self-pay

## 2016-11-09 NOTE — Progress Notes (Signed)
Called and informed pt of pre-op appt 11/17/16 at 9:00 am. Letter also mailed.

## 2016-11-09 NOTE — Telephone Encounter (Signed)
Called [employer] for PA for TCS/EGD. No PA needed. Ref# [id number].

## 2016-11-13 LAB — IRON AND TIBC
%SAT: 19 % (ref 15–60)
Iron: 86 ug/dL (ref 50–180)
TIBC: 445 ug/dL — ABNORMAL HIGH (ref 250–425)
UIBC: 359 ug/dL (ref 125–400)

## 2016-11-13 LAB — HEPATITIS B CORE ANTIBODY, TOTAL: HEP B C TOTAL AB: NONREACTIVE

## 2016-11-13 LAB — HEPATITIS B SURFACE ANTIGEN: HEP B S AG: NEGATIVE

## 2016-11-13 LAB — HEPATIC FUNCTION PANEL
ALBUMIN: 4.4 g/dL (ref 3.6–5.1)
ALK PHOS: 50 U/L (ref 40–115)
ALT: 47 U/L — ABNORMAL HIGH (ref 9–46)
AST: 51 U/L — AB (ref 10–35)
Bilirubin, Direct: 0.2 mg/dL (ref <=0.2)
Indirect Bilirubin: 0.4 mg/dL (ref 0.2–1.2)
Total Bilirubin: 0.6 mg/dL (ref 0.2–1.2)
Total Protein: 7.4 g/dL (ref 6.1–8.1)

## 2016-11-15 LAB — TISSUE TRANSGLUTAMINASE, IGA: Tissue Transglutaminase Ab, IgA: 1 U/mL (ref <4)

## 2016-11-15 LAB — IGA: IgA: 172 mg/dL (ref 81–463)

## 2016-11-15 NOTE — Progress Notes (Signed)
No celiac dz. Negative hep b. Previous hep c ab neg. No hemochromatosis.  AST/ALT remain slightly elevated but stable. ?related to statin vs fatty liver.   Procedures as planned.  Repeat LFTs in 3 months.

## 2016-11-16 ENCOUNTER — Other Ambulatory Visit: Payer: Self-pay

## 2016-11-16 NOTE — Progress Notes (Signed)
PT is aware. LFT's in 3 months.

## 2016-11-16 NOTE — Patient Instructions (Signed)
Lavonna MonarchDarrell K Barkan  11/16/2016     @PREFPERIOPPHARMACY @   Your procedure is scheduled on  11/23/2016  Report to [facility] at  700  A.M.  Call this number if you have problems the morning of surgery:  [phone]   Remember:  Do not eat food or drink liquids after midnight.  Take these medicines the morning of surgery with A SIP OF WATER  Nuvigil, levothyroxine, lisinopril, zofran, protonix.   Do not wear jewelry, make-up or nail polish.  Do not wear lotions, powders, or perfumes, or deoderant.  Do not shave 48 hours prior to surgery.  Men may shave face and neck.  Do not bring valuables to the hospital.  [facility] belongings or [facility]uables.  Contacts, dentures or bridgework may not be worn into surgery.  Leave your suitcase in the car.  After surgery it may be brought to your room.  For patients admitted to the hospital, discharge time will be determined by your treatment team.  Patients discharged the day of surgery will not be allowed to drive home.   Name and phone number of your driver:   family Special instructions:  Follow the diet and prep instructions given to you by Dr Evelina DunField's office.  Please read over the following fact sheets that you were given. Anesthesia Post-op Instructions and Care and Recovery After Surgery       Esophagogastroduodenoscopy Introduction Esophagogastroduodenoscopy (EGD) is a procedure to examine the lining of the esophagus, stomach, and first part of the small intestine (duodenum). This procedure is done to check for problems such as inflammation, bleeding, ulcers, or growths. During this procedure, a long, flexible, lighted tube with a camera attached (endoscope) is inserted down the throat. Tell a health care provider about:  Any allergies you have.  All medicines you are taking, including vitamins, herbs, eye drops, creams, and over-the-counter medicines.  Any problems you or family  members have had with anesthetic medicines.  Any blood disorders you have.  Any surgeries you have had.  Any medical conditions you have.  Whether you are pregnant or may be pregnant. What are the risks? Generally, this is a safe procedure. However, problems may occur, including:  Infection.  Bleeding.  A tear (perforation) in the esophagus, stomach, or duodenum.  Trouble breathing.  Excessive sweating.  Spasms of the larynx.  A slowed heartbeat.  Low blood pressure. What happens before the procedure?  Follow instructions from your health care provider about eating or drinking restrictions.  Ask your health care provider about:  Changing or stopping your regular medicines. This is especially important if you are taking diabetes medicines or blood thinners.  Taking medicines such as aspirin and ibuprofen. These medicines can thin your blood. Do not take these medicines before your procedure if your health care provider instructs you not to.  Plan to have someone take you home after the procedure.  If you wear dentures, be ready to remove them before the procedure. What happens during the procedure?  To reduce your risk of infection, your health care team will wash or sanitize their hands.  An IV tube will be put in a vein in your hand or arm. You will get medicines and fluids through this tube.  You will be given one or more of the following:  A medicine to help you relax (sedative).  A medicine to numb the area (local  anesthetic). This medicine may be sprayed into your throat. It will make you feel more comfortable and keep you from gagging or coughing during the procedure.  A medicine for pain.  A mouth guard may be placed in your mouth to protect your teeth and to keep you from biting on the endoscope.  You will be asked to lie on your left side.  The endoscope will be lowered down your throat into your esophagus, stomach, and duodenum.  Air will be put  into the endoscope. This will help your health care provider see better.  The lining of your esophagus, stomach, and duodenum will be examined.  Your health care provider may:  Take a tissue sample so it can be looked at in a lab (biopsy).  Remove growths.  Remove objects (foreign bodies) that are stuck.  Treat any bleeding with medicines or other devices that stop tissue from bleeding.  Widen (dilate) or stretch narrowed areas of your esophagus and stomach.  The endoscope will be taken out. The procedure may vary among health care providers and hospitals. What happens after the procedure?  Your blood pressure, heart rate, breathing rate, and blood oxygen level will be monitored often until the medicines you were given have worn off.  Do not eat or drink anything until the numbing medicine has worn off and your gag reflex has returned. This information is not intended to replace advice given to you by your health care provider. Make sure you discuss any questions you have with your health care provider. Document Released: 02/04/2005 Document Revised: 03/11/2016 Document Reviewed: 08/28/2015  2017 Elsevier Esophagogastroduodenoscopy, Care After Introduction Refer to this sheet in the next few weeks. These instructions provide you with information about caring for yourself after your procedure. Your health care provider may also give you more specific instructions. Your treatment has been planned according to current medical practices, but problems sometimes occur. Call your health care provider if you have any problems or questions after your procedure. What can I expect after the procedure? After the procedure, it is common to have:  A sore throat.  Nausea.  Bloating.  Dizziness.  Fatigue. Follow these instructions at home:  Do not eat or drink anything until the numbing medicine (local anesthetic) has worn off and your gag reflex has returned. You will know that the local  anesthetic has worn off when you can swallow comfortably.  Do not drive for 24 hours if you received a medicine to help you relax (sedative).  If your health care provider took a tissue sample for testing during the procedure, make sure to get your test results. This is your responsibility. Ask your health care provider or the department performing the test when your results will be ready.  Keep all follow-up visits as told by your health care provider. This is important. Contact a health care provider if:  You cannot stop coughing.  You are not urinating.  You are urinating less than usual. Get help right away if:  You have trouble swallowing.  You cannot eat or drink.  You have throat or chest pain that gets worse.  You are dizzy or light-headed.  You faint.  You have nausea or vomiting.  You have chills.  You have a fever.  You have severe abdominal pain.  You have black, tarry, or bloody stools. This information is not intended to replace advice given to you by your health care provider. Make sure you discuss any questions you have with your  health care provider. Document Released: 09/20/2012 Document Revised: 03/11/2016 Document Reviewed: 08/28/2015  2017 Elsevier  Colonoscopy, Adult A colonoscopy is an exam to look at the entire large intestine. During the exam, a lubricated, bendable tube is inserted into the anus and then passed into the rectum, colon, and other parts of the large intestine. A colonoscopy is often done as a part of normal colorectal screening or in response to certain symptoms, such as anemia, persistent diarrhea, abdominal pain, and blood in the stool. The exam can help screen for and diagnose medical problems, including:  Tumors.  Polyps.  Inflammation.  Areas of bleeding. Tell a health care provider about:  Any allergies you have.  All medicines you are taking, including vitamins, herbs, eye drops, creams, and over-the-counter  medicines.  Any problems you or family members have had with anesthetic medicines.  Any blood disorders you have.  Any surgeries you have had.  Any medical conditions you have.  Any problems you have had passing stool. What are the risks? Generally, this is a safe procedure. However, problems may occur, including:  Bleeding.  A tear in the intestine.  A reaction to medicines given during the exam.  Infection (rare). What happens before the procedure? Eating and drinking restrictions  Follow instructions from your health care provider about eating and drinking, which may include:  A few days before the procedure - follow a low-fiber diet. Avoid nuts, seeds, dried fruit, raw fruits, and vegetables.  1-3 days before the procedure - follow a clear liquid diet. Drink only clear liquids, such as clear broth or bouillon, black coffee or tea, clear juice, clear soft drinks or sports drinks, gelatin desert, and popsicles. Avoid any liquids that contain red or purple dye.  On the day of the procedure - do not eat or drink anything during the 2 hours before the procedure, or within the time period that your health care provider recommends. Bowel prep  If you were prescribed an oral bowel prep to clean out your colon:  Take it as told by your health care provider. Starting the day before your procedure, you will need to drink a large amount of medicated liquid. The liquid will cause you to have multiple loose stools until your stool is almost clear or light green.  If your skin or anus gets irritated from diarrhea, you may use these to relieve the irritation:  Medicated wipes, such as adult wet wipes with aloe and vitamin E.  A skin soothing-product like petroleum jelly.  If you vomit while drinking the bowel prep, take a break for up to 60 minutes and then begin the bowel prep again. If vomiting continues and you cannot take the bowel prep without vomiting, call your health care  provider. General instructions  Ask your health care provider about changing or stopping your regular medicines. This is especially important if you are taking diabetes medicines or blood thinners.  Plan to have someone take you home from the hospital or clinic. What happens during the procedure?  An IV tube may be inserted into one of your veins.  You will be given medicine to help you relax (sedative).  To reduce your risk of infection:  Your health care team will wash or sanitize their hands.  Your anal area will be washed with soap.  You will be asked to lie on your side with your knees bent.  Your health care provider will lubricate a long, thin, flexible tube. The tube will have a  camera and a light on the end.  The tube will be inserted into your anus.  The tube will be gently eased through your rectum and colon.  Air will be delivered into your colon to keep it open. You may feel some pressure or cramping.  The camera will be used to take images during the procedure.  A small tissue sample may be removed from your body to be examined under a microscope (biopsy). If any potential problems are found, the tissue will be sent to a lab for testing.  If small polyps are found, your health care provider may remove them and have them checked for cancer cells.  The tube that was inserted into your anus will be slowly removed. The procedure may vary among health care providers and hospitals. What happens after the procedure?  Your blood pressure, heart rate, breathing rate, and blood oxygen level will be monitored until the medicines you were given have worn off.  Do not drive for 24 hours after the exam.  You may have a small amount of blood in your stool.  You may pass gas and have mild abdominal cramping or bloating due to the air that was used to inflate your colon during the exam.  It is up to you to get the results of your procedure. Ask your health care provider, or  the department performing the procedure, when your results will be ready. This information is not intended to replace advice given to you by your health care provider. Make sure you discuss any questions you have with your health care provider. Document Released: 10/01/2000 Document Revised: 04/23/2016 Document Reviewed: 12/16/2015 Elsevier Interactive Patient Education  2017 [employer]  Colonoscopy, Adult, Care After This sheet gives you information about how to care for yourself after your procedure. Your health care provider may also give you more specific instructions. If you have problems or questions, contact your health care provider. What can I expect after the procedure? After the procedure, it is common to have:  A small amount of blood in your stool for 24 hours after the procedure.  Some gas.  Mild abdominal cramping or bloating. Follow these instructions at home: General instructions  For the first 24 hours after the procedure:  Do not drive or use machinery.  Do not sign important documents.  Do not drink alcohol.  Do your regular daily activities at a slower pace than normal.  Eat soft, easy-to-digest foods.  Rest often.  Take over-the-counter or prescription medicines only as told by your health care provider.  It is up to you to get the results of your procedure. Ask your health care provider, or the department performing the procedure, when your results will be ready. Relieving cramping and bloating  Try walking around when you have cramps or feel bloated.  Apply heat to your abdomen as told by your health care provider. Use a heat source that your health care provider recommends, such as a moist heat pack or a heating pad.  Place a towel between your skin and the heat source.  Leave the heat on for 20-30 minutes.  Remove the heat if your skin turns bright red. This is especially important if you are unable to feel pain, heat, or cold. You may have a  greater risk of getting burned. Eating and drinking  Drink enough fluid to keep your urine clear or pale yellow.  Resume your normal diet as instructed by your health care provider. Avoid heavy or fried  foods that are hard to digest.  Avoid drinking alcohol for as long as instructed by your health care provider. Contact a health care provider if:  You have blood in your stool 2-3 days after the procedure. Get help right away if:  You have more than a small spotting of blood in your stool.  You pass large blood clots in your stool.  Your abdomen is swollen.  You have nausea or vomiting.  You have a fever.  You have increasing abdominal pain that is not relieved with medicine. This information is not intended to replace advice given to you by your health care provider. Make sure you discuss any questions you have with your health care provider. Document Released: 05/18/2004 Document Revised: 06/28/2016 Document Reviewed: 12/16/2015 Elsevier Interactive Patient Education  2017 Else[employer] Inc.  Monitored Anesthesia Care Anesthesia is a term that refers to techniques, procedures, and medicines that help a person stay safe and comfortable during a medical procedure. Monitored anesthesia care, or sedation, is one type of anesthesia. Your anesthesia specialist may recommend sedation if you will be having a procedure that does not require you to be unconscious, such as:  Cataract surgery.  A dental procedure.  A biopsy.  A colonoscopy. During the procedure, you may receive a medicine to help you relax (sedative). There are three levels of sedation:  Mild sedation. At this level, you may feel awake and relaxed. You will be able to follow directions.  Moderate sedation. At this level, you will be sleepy. You may not remember the procedure.  Deep sedation. At this level, you will be asleep. You will not remember the procedure. The more medicine you are given, the deeper your level of  sedation will be. Depending on how you respond to the procedure, the anesthesia specialist may change your level of sedation or the type of anesthesia to fit your needs. An anesthesia specialist will monitor you closely during the procedure. Let your health care provider know about:  Any allergies you have.  All medicines you are taking, including vitamins, herbs, eye drops, creams, and over-the-counter medicines.  Any use of steroids (by mouth or as a cream).  Any problems you or family members have had with sedatives and anesthetic medicines.  Any blood disorders you have.  Any surgeries you have had.  Any medical conditions you have, such as sleep apnea.  Whether you are pregnant or may be pregnant.  Any use of cigarettes, alcohol, or street drugs. What are the risks? Generally, this is a safe procedure. However, problems may occur, including:  Getting too much medicine (oversedation).  Nausea.  Allergic reaction to medicines.  Trouble breathing. If this happens, a breathing tube may be used to help with breathing. It will be removed when you are awake and breathing on your own.  Heart trouble.  Lung trouble. Before the procedure Staying hydrated  Follow instructions from your health care provider about hydration, which may include:  Up to 2 hours before the procedure - you may continue to drink clear liquids, such as water, clear fruit juice, black coffee, and plain tea. Eating and drinking restrictions  Follow instructions from your health care provider about eating and drinking, which may include:  8 hours before the procedure - stop eating heavy meals or foods such as meat, fried foods, or fatty foods.  6 hours before the procedure - stop eating light meals or foods, such as toast or cereal.  6 hours before the procedure - stop drinking  milk or drinks that contain milk.  2 hours before the procedure - stop drinking clear liquids. Medicines  Ask your health  care provider about:  Changing or stopping your regular medicines. This is especially important if you are taking diabetes medicines or blood thinners.  Taking medicines such as aspirin and ibuprofen. These medicines can thin your blood. Do not take these medicines before your procedure if your health care provider instructs you not to. Tests and exams  You will have a physical exam.  You may have blood tests done to show:  How well your kidneys and liver are working.  How well your blood can clot.  General instructions  Plan to have someone take you home from the hospital or clinic.  If you will be going home right after the procedure, plan to have someone with you for 24 hours. What happens during the procedure?  Your blood pressure, heart rate, breathing, level of pain and overall condition will be monitored.  An IV tube will be inserted into one of your veins.  Your anesthesia specialist will give you medicines as needed to keep you comfortable during the procedure. This may mean changing the level of sedation.  The procedure will be performed. After the procedure  Your blood pressure, heart rate, breathing rate, and blood oxygen level will be monitored until the medicines you were given have worn off.  Do not drive for 24 hours if you received a sedative.  You may:  Feel sleepy, clumsy, or nauseous.  Feel forgetful about what happened after the procedure.  Have a sore throat if you had a breathing tube during the procedure.  Vomit. This information is not intended to replace advice given to you by your health care provider. Make sure you discuss any questions you have with your health care provider. Document Released: 06/30/2005 Document Revised: 03/12/2016 Document Reviewed: 01/25/2016 Elsevier Interactive Patient Education  2017 [employer] Inc. Monitored Anesthesia Care, Care After These instructions provide you with information about caring for yourself after  your procedure. Your health care provider may also give you more specific instructions. Your treatment has been planned according to current medical practices, but problems sometimes occur. Call your health care provider if you have any problems or questions after your procedure. What can I expect after the procedure? After your procedure, it is common to:  Feel sleepy for several hours.  Feel clumsy and have poor balance for several hours.  Feel forgetful about what happened after the procedure.  Have poor judgment for several hours.  Feel nauseous or vomit.  Have a sore throat if you had a breathing tube during the procedure. Follow these instructions at home: For at least 24 hours after the procedure:   Do not:  Participate in activities in which you could fall or become injured.  Drive.  Use heavy machinery.  Drink alcohol.  Take sleeping pills or medicines that cause drowsiness.  Make important decisions or sign legal documents.  Take care of children on your own.  Rest. Eating and drinking  Follow the diet that is recommended by your health care provider.  If you vomit, drink water, juice, or soup when you can drink without vomiting.  Make sure you have little or no nausea before eating solid foods. General instructions  Have a responsible adult stay with you until you are awake and alert.  Take over-the-counter and prescription medicines only as told by your health care provider.  If you smoke, do not smoke  without supervision.  Keep all follow-up visits as told by your health care provider. This is important. Contact a health care provider if:  You keep feeling nauseous or you keep vomiting.  You feel light-headed.  You develop a rash.  You have a fever. Get help right away if:  You have trouble breathing. This information is not intended to replace advice given to you by your health care provider. Make sure you discuss any questions you have  with your health care provider. Document Released: 01/25/2016 Document Revised: 05/26/2016 Document Reviewed: 01/25/2016 Elsevier Interactive Patient Education  2017 [employer].

## 2016-11-17 ENCOUNTER — Encounter (HOSPITAL_COMMUNITY): Payer: Self-pay

## 2016-11-17 ENCOUNTER — Encounter (HOSPITAL_COMMUNITY)
Admission: RE | Admit: 2016-11-17 | Discharge: 2016-11-17 | Disposition: A | Discharge status: Home / Self Care | Payer: BLUE CROSS/BLUE SHIELD | Source: Ambulatory Visit | Attending: Gastroenterology | Admitting: Gastroenterology

## 2016-11-17 DIAGNOSIS — I10 Essential (primary) hypertension: Secondary | ICD-10-CM | POA: Insufficient documentation

## 2016-11-17 DIAGNOSIS — R7989 Other specified abnormal findings of blood chemistry: Secondary | ICD-10-CM | POA: Insufficient documentation

## 2016-11-17 DIAGNOSIS — G4730 Sleep apnea, unspecified: Secondary | ICD-10-CM | POA: Diagnosis not present

## 2016-11-17 DIAGNOSIS — E039 Hypothyroidism, unspecified: Secondary | ICD-10-CM | POA: Insufficient documentation

## 2016-11-17 DIAGNOSIS — R197 Diarrhea, unspecified: Secondary | ICD-10-CM | POA: Insufficient documentation

## 2016-11-17 DIAGNOSIS — Z01812 Encounter for preprocedural laboratory examination: Secondary | ICD-10-CM | POA: Diagnosis not present

## 2016-11-17 DIAGNOSIS — K219 Gastro-esophageal reflux disease without esophagitis: Secondary | ICD-10-CM | POA: Insufficient documentation

## 2016-11-17 DIAGNOSIS — F419 Anxiety disorder, unspecified: Secondary | ICD-10-CM | POA: Diagnosis not present

## 2016-11-17 DIAGNOSIS — M1990 Unspecified osteoarthritis, unspecified site: Secondary | ICD-10-CM | POA: Diagnosis not present

## 2016-11-17 DIAGNOSIS — Z01810 Encounter for preprocedural cardiovascular examination: Secondary | ICD-10-CM | POA: Insufficient documentation

## 2016-11-17 DIAGNOSIS — E7800 Pure hypercholesterolemia, unspecified: Secondary | ICD-10-CM | POA: Insufficient documentation

## 2016-11-17 HISTORY — DX: Unspecified osteoarthritis, unspecified site: M19.90

## 2016-11-17 HISTORY — DX: Anxiety disorder, unspecified: F41.9

## 2016-11-17 HISTORY — DX: Gastro-esophageal reflux disease without esophagitis: K21.9

## 2016-11-17 LAB — CBC WITH DIFFERENTIAL/PLATELET
BASOS PCT: 1 %
Basophils Absolute: 0.1 10*3/uL (ref 0.0–0.1)
EOS ABS: 0.2 10*3/uL (ref 0.0–0.7)
EOS PCT: 2 %
HCT: 42.9 % (ref 39.0–52.0)
Hemoglobin: 14.2 g/dL (ref 13.0–17.0)
Lymphocytes Relative: 34 %
Lymphs Abs: 3.6 10*3/uL (ref 0.7–4.0)
MCH: 28.6 pg (ref 26.0–34.0)
MCHC: 33.1 g/dL (ref 30.0–36.0)
MCV: 86.3 fL (ref 78.0–100.0)
MONO ABS: 1 10*3/uL (ref 0.1–1.0)
MONOS PCT: 10 %
NEUTROS PCT: 53 %
Neutro Abs: 5.6 10*3/uL (ref 1.7–7.7)
PLATELETS: 483 10*3/uL — AB (ref 150–400)
RBC: 4.97 MIL/uL (ref 4.22–5.81)
RDW: 13.8 % (ref 11.5–15.5)
WBC: 10.4 10*3/uL (ref 4.0–10.5)

## 2016-11-17 LAB — BASIC METABOLIC PANEL
Anion gap: 9 (ref 5–15)
BUN: 11 mg/dL (ref 6–20)
CALCIUM: 9.4 mg/dL (ref 8.9–10.3)
CO2: 28 mmol/L (ref 22–32)
CREATININE: 1.06 mg/dL (ref 0.61–1.24)
Chloride: 95 mmol/L — ABNORMAL LOW (ref 101–111)
GFR calc non Af Amer: >60 mL/min (ref >60)
Glucose, Bld: 84 mg/dL (ref 65–99)
Potassium: 4 mmol/L (ref 3.5–5.1)
SODIUM: 132 mmol/L — AB (ref 135–145)

## 2016-11-17 NOTE — Pre-Procedure Instructions (Signed)
Dr Jayme CloudGonzalez shown EKG. No orders given.

## 2016-11-23 ENCOUNTER — Encounter (HOSPITAL_COMMUNITY): Payer: Self-pay | Admitting: *Deleted

## 2016-11-23 ENCOUNTER — Ambulatory Visit (HOSPITAL_COMMUNITY): Payer: BLUE CROSS/BLUE SHIELD | Admitting: Anesthesiology

## 2016-11-23 ENCOUNTER — Ambulatory Visit (HOSPITAL_COMMUNITY)
Admission: RE | Admit: 2016-11-23 | Discharge: 2016-11-23 | Disposition: A | Discharge status: Home / Self Care | Payer: BLUE CROSS/BLUE SHIELD | Source: Ambulatory Visit | Attending: Gastroenterology | Admitting: Gastroenterology

## 2016-11-23 ENCOUNTER — Encounter (HOSPITAL_COMMUNITY)
Admission: RE | Disposition: A | Discharge status: Home / Self Care | Payer: Self-pay | Source: Ambulatory Visit | Attending: Gastroenterology

## 2016-11-23 DIAGNOSIS — K633 Ulcer of intestine: Secondary | ICD-10-CM | POA: Insufficient documentation

## 2016-11-23 DIAGNOSIS — Z13810 Encounter for screening for upper gastrointestinal disorder: Secondary | ICD-10-CM

## 2016-11-23 DIAGNOSIS — E039 Hypothyroidism, unspecified: Secondary | ICD-10-CM | POA: Insufficient documentation

## 2016-11-23 DIAGNOSIS — F419 Anxiety disorder, unspecified: Secondary | ICD-10-CM | POA: Diagnosis not present

## 2016-11-23 DIAGNOSIS — E7800 Pure hypercholesterolemia, unspecified: Secondary | ICD-10-CM | POA: Insufficient documentation

## 2016-11-23 DIAGNOSIS — K8681 Exocrine pancreatic insufficiency: Secondary | ICD-10-CM

## 2016-11-23 DIAGNOSIS — K449 Diaphragmatic hernia without obstruction or gangrene: Secondary | ICD-10-CM | POA: Insufficient documentation

## 2016-11-23 DIAGNOSIS — K3189 Other diseases of stomach and duodenum: Secondary | ICD-10-CM | POA: Diagnosis not present

## 2016-11-23 DIAGNOSIS — K648 Other hemorrhoids: Secondary | ICD-10-CM | POA: Insufficient documentation

## 2016-11-23 DIAGNOSIS — Z7982 Long term (current) use of aspirin: Secondary | ICD-10-CM | POA: Insufficient documentation

## 2016-11-23 DIAGNOSIS — Z79899 Other long term (current) drug therapy: Secondary | ICD-10-CM | POA: Insufficient documentation

## 2016-11-23 DIAGNOSIS — K5730 Diverticulosis of large intestine without perforation or abscess without bleeding: Secondary | ICD-10-CM | POA: Diagnosis not present

## 2016-11-23 DIAGNOSIS — K219 Gastro-esophageal reflux disease without esophagitis: Secondary | ICD-10-CM | POA: Diagnosis not present

## 2016-11-23 DIAGNOSIS — I10 Essential (primary) hypertension: Secondary | ICD-10-CM | POA: Insufficient documentation

## 2016-11-23 DIAGNOSIS — K529 Noninfective gastroenteritis and colitis, unspecified: Secondary | ICD-10-CM | POA: Diagnosis not present

## 2016-11-23 DIAGNOSIS — G4730 Sleep apnea, unspecified: Secondary | ICD-10-CM | POA: Diagnosis not present

## 2016-11-23 DIAGNOSIS — K625 Hemorrhage of anus and rectum: Secondary | ICD-10-CM

## 2016-11-23 HISTORY — PX: BIOPSY: SHX5522

## 2016-11-23 HISTORY — PX: COLONOSCOPY WITH PROPOFOL: SHX5780

## 2016-11-23 HISTORY — PX: ESOPHAGOGASTRODUODENOSCOPY (EGD) WITH PROPOFOL: SHX5813

## 2016-11-23 SURGERY — COLONOSCOPY WITH PROPOFOL
Anesthesia: Monitor Anesthesia Care

## 2016-11-23 MED ORDER — MIDAZOLAM HCL 2 MG/2ML IJ SOLN
INTRAMUSCULAR | Status: AC
  Filled 2016-11-23: qty 2

## 2016-11-23 MED ORDER — CHLORHEXIDINE GLUCONATE CLOTH 2 % EX PADS: 6.0000 | MEDICATED_PAD | Freq: Once | CUTANEOUS | Status: DC

## 2016-11-23 MED ORDER — MIDAZOLAM HCL 5 MG/5ML IJ SOLN
INTRAMUSCULAR | Status: DC | PRN
  Administered 2016-11-23: 1 mg via INTRAVENOUS

## 2016-11-23 MED ORDER — PROPOFOL 10 MG/ML IV BOLUS
INTRAVENOUS | Status: DC | PRN
  Administered 2016-11-23 (×2): 11.1 mg via INTRAVENOUS

## 2016-11-23 MED ORDER — PANCRELIPASE (LIP-PROT-AMYL) 36000-114000 UNITS PO CPEP: ORAL_CAPSULE | ORAL | 11 refills | Status: DC

## 2016-11-23 MED ORDER — LIDOCAINE HCL (PF) 1 % IJ SOLN
INTRAMUSCULAR | Status: AC
  Filled 2016-11-23: qty 5

## 2016-11-23 MED ORDER — PROPOFOL 500 MG/50ML IV EMUL
INTRAVENOUS | Status: DC | PRN
  Administered 2016-11-23 (×2): 90 ug/kg/min via INTRAVENOUS
  Administered 2016-11-23: 75 ug/kg/min via INTRAVENOUS

## 2016-11-23 MED ORDER — LIDOCAINE VISCOUS 2 % MT SOLN
15.0000 mL | Freq: Once | OROMUCOSAL | Status: AC
  Administered 2016-11-23: 3 mL via OROMUCOSAL

## 2016-11-23 MED ORDER — MIDAZOLAM HCL 2 MG/2ML IJ SOLN
1.0000 mg | INTRAMUSCULAR | Status: DC | PRN
  Administered 2016-11-23: 2 mg via INTRAVENOUS
  Filled 2016-11-23: qty 2

## 2016-11-23 MED ORDER — LIDOCAINE HCL (PF) 1 % IJ SOLN
INTRAMUSCULAR | Status: DC | PRN
  Administered 2016-11-23: 50 mg

## 2016-11-23 MED ORDER — SODIUM CHLORIDE 0.9% FLUSH
INTRAVENOUS | Status: AC
  Filled 2016-11-23: qty 10

## 2016-11-23 MED ORDER — FENTANYL CITRATE (PF) 100 MCG/2ML IJ SOLN
25.0000 ug | INTRAMUSCULAR | Status: AC
  Administered 2016-11-23 (×2): 25 ug via INTRAVENOUS

## 2016-11-23 MED ORDER — PROPOFOL 10 MG/ML IV BOLUS
INTRAVENOUS | Status: AC
  Filled 2016-11-23: qty 20

## 2016-11-23 MED ORDER — LIDOCAINE HCL (PF) 1 % IJ SOLN
INTRAMUSCULAR | Status: AC
  Filled 2016-11-23: qty 10

## 2016-11-23 MED ORDER — PROPOFOL 10 MG/ML IV BOLUS
INTRAVENOUS | Status: AC
  Filled 2016-11-23: qty 40

## 2016-11-23 MED ORDER — FENTANYL CITRATE (PF) 100 MCG/2ML IJ SOLN
INTRAMUSCULAR | Status: AC
  Filled 2016-11-23: qty 2

## 2016-11-23 MED ORDER — LIDOCAINE VISCOUS 2 % MT SOLN
OROMUCOSAL | Status: AC
  Filled 2016-11-23: qty 15

## 2016-11-23 MED ORDER — LACTATED RINGERS IV SOLN
INTRAVENOUS | Status: DC
  Administered 2016-11-23: 1000 mL via INTRAVENOUS

## 2016-11-23 NOTE — Progress Notes (Signed)
Please excuse Mr. Rosalyn Chartersanes on Sunday, November 21, 2016, from work due to colonoscopy and upper endoscopy prep.    Thank,   Toniann FailBonnie Aviyanna Colbaugh, Rn

## 2016-11-23 NOTE — Anesthesia Preprocedure Evaluation (Signed)
Anesthesia Evaluation  Patient identified by MRN, date of birth, ID band Patient awake    Reviewed: Allergy & Precautions, NPO status , Patient's Chart, lab work & pertinent test results  Airway Mallampati: II  TM Distance: >3 FB Neck ROM: Full    Dental  (+) Teeth Intact   Pulmonary sleep apnea ,    Pulmonary exam normal        Cardiovascular hypertension,  Rhythm:Regular Rate:Normal     Neuro/Psych PSYCHIATRIC DISORDERS Anxiety    GI/Hepatic GERD  ,(+)     substance abuse  alcohol use,   Endo/Other  Hypothyroidism   Renal/GU      Musculoskeletal   Abdominal   Peds  Hematology   Anesthesia Other Findings Hx ETOH abuse in remission.  Reproductive/Obstetrics                             Anesthesia Physical Anesthesia Plan  ASA: III  Anesthesia Plan: MAC   Post-op Pain Management:    Induction: Intravenous  Airway Management Planned: Simple Face Mask  Additional Equipment:   Intra-op Plan:   Post-operative Plan:   Informed Consent: I have reviewed the patients History and Physical, chart, labs and discussed the procedure including the risks, benefits and alternatives for the proposed anesthesia with the patient or authorized representative who has indicated his/her understanding and acceptance.     Plan Discussed with:   Anesthesia Plan Comments:         Anesthesia Quick Evaluation

## 2016-11-23 NOTE — Op Note (Signed)
[facility] Patient Name: Jeremiah Zamora Procedure Date: 11/23/2016 9:23 AM MRN: [id number] Date of Birth: 05-16-1966 Attending MD: Jonette Eva , MD CSN: [id number] Age: 51 Admit Type: Outpatient Procedure:                Upper GI endoscopy WITH COLD FORCEPS BIOPSY Indications:              Screening for Barrett's esophagus. DIARRHEA EVERY                            1-3 MOS. PSHX: PANCREATECTOMY AND ETOH ABUSE. Providers:                Jonette Eva, MD, Brain Hilts RN, RN, Burke Keels, Technician Referring MD:             Donnita Falls Medicines:                Propofol per Anesthesia Complications:            No immediate complications. Estimated Blood Loss:     Estimated blood loss was minimal. Procedure:                Pre-Anesthesia Assessment:                           - Prior to the procedure, a History and Physical                            was performed, and patient medications and                            allergies were reviewed. The patient's tolerance of                            previous anesthesia was also reviewed. The risks                            and benefits of the procedure and the sedation                            options and risks were discussed with the patient.                            All questions were answered, and informed consent                            was obtained. Prior Anticoagulants: The patient has                            taken aspirin. ASA Grade Assessment: II - A patient                            with mild systemic disease. After reviewing the  risks and benefits, the patient was deemed in                            satisfactory condition to undergo the procedure.                            After obtaining informed consent, the endoscope was                            passed under direct vision. Throughout the                            procedure, the patient's blood  pressure, pulse, and                            oxygen saturations were monitored continuously. The                            [id number] ([id number]) scope was introduced through the                            mouth, and advanced to the second part of duodenum.                            The upper GI endoscopy was accomplished without                            difficulty. The patient tolerated the procedure                            well. Scope In: 9:33:30 AM Scope Out: 9:46:46 AM Total Procedure Duration: 0 hours 13 minutes 16 seconds  Findings:      The examined esophagus was normal.      A small hiatal hernia was present.      A few localized, 2 to 4 mm non-bleeding erosions were found in the       gastric antrum. There were no stigmata of recent bleeding. Biopsies were       taken with a cold forceps for Helicobacter pylori testing.      The examined duodenum was normal. Biopsies for histology were taken with       a cold forceps for evaluation of celiac disease. Impression:               - Normal esophagus.                           - Small hiatal hernia.                           - Non-bleeding erosive gastropathy. Biopsied.                           - Normal examined duodenum. Biopsied. Moderate Sedation:      Per Anesthesia Care Recommendation:           - High fiber diet and low fat diet.  LOSE WEIGHT.                            ADD CREON 1-2 WITH MEALS AND SNACKS.CALL IN ONE                            MONTH IF SYMPTOMS NOT IMPROVED AND WILL RX:                            CIP/FLAGYL FOR SIBO.                           - Continue present medications.                           - Await pathology results.                           - Return to my office in 4 months.                           - Patient has a contact number available for                            emergencies. The signs and symptoms of potential                            delayed complications were discussed with the                             patient. Return to normal activities tomorrow.                            Written discharge instructions were provided to the                            patient. Procedure Code(s):        --- Professional ---                           [phone], Esophagogastroduodenoscopy, flexible,                            transoral; with biopsy, single or multiple Diagnosis Code(s):        --- Professional ---                           K44.9, Diaphragmatic hernia without obstruction or                            gangrene                           K31.89, Other diseases of stomach and duodenum                           Z13.810,  Encounter for screening for upper                            gastrointestinal disorder CPT copyright 2016 [employer]. All rights reserved. The codes documented in this report are preliminary and upon coder review may  be revised to meet current compliance requirements. Jonette Eva, MD Jonette Eva, MD 11/23/2016 10:11:36 AM This report has been signed electronically. Number of Addenda: 0
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
## 2016-11-23 NOTE — Anesthesia Postprocedure Evaluation (Signed)
Anesthesia Post Note  Patient: Jeremiah Zamora  Procedure(s) Performed: Procedure(s) (LRB): COLONOSCOPY WITH PROPOFOL (N/A) ESOPHAGOGASTRODUODENOSCOPY (EGD) WITH PROPOFOL (N/A) BIOPSY  Patient location during evaluation: PACU Anesthesia Type: MAC Level of consciousness: awake and alert Pain management: satisfactory to patient Vital Signs Assessment: post-procedure vital signs reviewed and stable Respiratory status: spontaneous breathing Cardiovascular status: stable Anesthetic complications: no     Last Vitals:  Vitals:   11/23/16 0955 11/23/16 1000  BP: 137/90 130/88  Pulse: 87 71  Resp: 19 15  Temp: 36.3 C     Last Pain:  Vitals:   11/23/16 0715  TempSrc: Oral  PainSc: 4                  [name]

## 2016-11-23 NOTE — Op Note (Signed)
[facility] Patient Name: Jeremiah Zamora Procedure Date: 11/23/2016 8:46 AM MRN: [id number] Date of Birth: 01-08-1966 Attending MD: Jonette Eva , MD CSN: [id number] Age: 51 Admit Type: Outpatient Procedure:                Colonoscopy WITH COLD FORCEPS BIOPSY Indications:              Chronic diarrhea Providers:                Jonette Eva, MD, Loma Messing B. Patsy Lager, RN, Burke Keels, Technician Referring MD:             Donnita Falls Medicines:                Propofol per Anesthesia Complications:            No immediate complications. Estimated Blood Loss:     Estimated blood loss was minimal. Procedure:                Pre-Anesthesia Assessment:                           - Prior to the procedure, a History and Physical                            was performed, and patient medications and                            allergies were reviewed. The patient's tolerance of                            previous anesthesia was also reviewed. The risks                            and benefits of the procedure and the sedation                            options and risks were discussed with the patient.                            All questions were answered, and informed consent                            was obtained. Prior Anticoagulants: The patient has                            taken aspirin. ASA Grade Assessment: II - A patient                            with mild systemic disease. After reviewing the                            risks and benefits, the patient was deemed in  satisfactory condition to undergo the procedure.                            After obtaining informed consent, the colonoscope                            was passed under direct vision. Throughout the                            procedure, the patient's blood pressure, pulse, and                            oxygen saturations were monitored continuously. The                    [id number] ([id number]) scope was introduced through                            the anus and advanced to the 10 cm into the ileum.                            The terminal ileum, ileocecal valve, appendiceal                            orifice, and rectum were photographed. The                            colonoscopy was somewhat difficult due to a                            tortuous colon. Successful completion of the                            procedure was aided by COLOWRAP. The patient                            tolerated the procedure well. The quality of the                            bowel preparation was excellent. Scope In: 9:02:26 AM Scope Out: 9:23:08 AM Scope Withdrawal Time: 0 hours 15 minutes 54 seconds  Total Procedure Duration: 0 hours 20 minutes 42 seconds  Findings:      FEW Discontinuous areas of nonbleeding ulcerated mucosa with no stigmata       of recent bleeding were present in the descending colon and in the       cecum. This was biopsied with a cold forceps for evaluation of       microscopic colitis OR IBD IN THE RIGHT COLON,LEFT COLON , AND RECTUM.      Multiple small-mouthed diverticula were found in the recto-sigmoid colon       and sigmoid colon.      Internal hemorrhoids were found during retroflexion. The hemorrhoids       were moderate.      The distal ileum contained a few one mm ulcers. No bleeding was present.  This was biopsied with a cold forceps for histology. Impression:               - ULCERS IN ILEUM AND COLON MOST LIKEY DUE TO                            ASPIRIN USE                           - Diverticulosis in the recto-sigmoid colon and in                            the sigmoid colon.                           - Internal hemorrhoids.                           - NO OBVIOUS SOURCE FOR DIARRHEA IDENTIFIED. 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 Moderate Sedation:      Per Anesthesia Care Recommendation:           - High fiber diet and low fat diet. LOSE WEIGHT.                           - Continue present medications.                           - Await pathology results.                           - Repeat colonoscopy in 10 years for surveillance.                           - Return to GI office in 4 months.                           - Patient has a contact number available for                            emergencies. The signs and symptoms of potential                            delayed complications were discussed with the                            patient. Return to normal activities tomorrow.                            Written discharge instructions were provided to the  patient. Procedure Code(s):        --- Professional ---                           [phone], Colonoscopy, flexible; with biopsy, single                            or multiple Diagnosis Code(s):        --- Professional ---                           K63.3, Ulcer of intestine                           K64.8, Other hemorrhoids                           K52.9, Noninfective gastroenteritis and colitis,                            unspecified                           K57.30, Diverticulosis of large intestine without                            perforation or abscess without bleeding CPT copyright 2016 American Medical Association. All rights reserved. The codes documented in this report are preliminary and upon coder review may  be revised to meet current compliance requirements. Jonette Eva, MD Jonette Eva, MD 11/23/2016 10:07:30 AM This report has been signed electronically. Number of Addenda: 0

## 2016-11-23 NOTE — Transfer of Care (Signed)
Immediate Anesthesia Transfer of Care Note  Patient: Jeremiah Zamora  Procedure(s) Performed: Procedure(s) with comments: COLONOSCOPY WITH PROPOFOL (N/A) - 8:45 AM ESOPHAGOGASTRODUODENOSCOPY (EGD) WITH PROPOFOL (N/A) BIOPSY - colon duodenum gastric  Patient Location: PACU  Anesthesia Type:MAC  Level of Consciousness: awake and patient cooperative  Airway & Oxygen Therapy: Patient Spontanous Breathing and non-rebreather face mask  Post-op Assessment: Report given to RN and Post -op Vital signs reviewed and stable  Post vital signs: Reviewed and stable  Last Vitals:  Vitals:   11/23/16 0830 11/23/16 0845  BP: 108/71 130/76  Pulse:    Resp: 14 18  Temp:      Last Pain:  Vitals:   11/23/16 0715  TempSrc: Oral  PainSc: 4       Patients Stated Pain Goal: 8 (94/80/16 5537)  Complications: No apparent anesthesia complications

## 2016-11-23 NOTE — Anesthesia Procedure Notes (Signed)
Procedure Name: MAC Date/Time: 11/23/2016 8:46 AM Performed by: Vista Deck Pre-anesthesia Checklist: Patient identified, Emergency Drugs available, Suction available, Timeout performed and Patient being monitored Patient Re-evaluated:Patient Re-evaluated prior to inductionOxygen Delivery Method: Non-rebreather mask

## 2016-11-23 NOTE — Discharge Instructions (Signed)
NO OBVIOUS SOURCE FOR YOUR DIARRHEA WAS IDENTIFIED. YOU DID HAVE A FEW SMALL ULCERS IN YOUR SMALL BOWEL AND COLON AND EROSIVE GASTRITIS MOST LIKELY DUE TO ASPIRIN USE. 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 YOU HAVE MILD DIVERTICULOSIS IN YOUR LEFT COLON.  I BIOPSIED YOUR STOMACH, SMALL BOWEL, AND COLON. YOU DO NOT HAVE BARRETT'S ESOPHAGUS.     START CREON 1-2 WITH MEALS AND WITH SNACKS.  DRINK WATER TO KEEP YOUR URINE LIGHT YELLOW.   FOLLOW A LOW FAT/HIGH FIBER DIET. AVOID ITEMS THAT CAUSE BLOATING. SEE INFO BELOW.  CONTINUE YOUR WEIGHT LOSS EFFORTS.  CONTINUE PROTONIX. TAKE 30 MINUTES PRIOR TO BREAKFAST.  YOUR BIOPSY RESULTS WILL BE AVAILABLE IN MY CHART FEB 9 AND MY OFFICE WILL CONTACT YOU IN 10-14 DAYS WITH YOUR RESULTS.   PLEASE CALL IN ONE MONTH IF YOUR DIARRHEA IS NOT BETTER.  FOLLOW UP IN 4 MOS.   ENDOSCOPY Care After Read the instructions outlined below and refer to this sheet in the next week. These discharge instructions provide you with general information on caring for yourself after you leave the hospital. While your treatment has been planned according to the most current medical practices available, unavoidable complications occasionally occur. If you have any problems or questions after discharge, call DR. Brighton Delio, [phone].  ACTIVITY  You may resume your regular activity, but move at a slower pace for the next 24 hours.   Take frequent rest periods for the next 24 hours.   Walking will help get rid of the air and reduce the bloated feeling in your belly (abdomen).   No driving for 24 hours (because of the medicine (anesthesia) used during the test).   You may shower.   Do not sign any important legal documents or operate any machinery for 24 hours (because of the anesthesia used during the test).    NUTRITION  Drink plenty of fluids.   You may resume your normal diet as instructed  by your doctor.   Begin with a light meal and progress to your normal diet. Heavy or fried foods are harder to digest and may make you feel sick to your stomach (nauseated).   Avoid alcoholic beverages for 24 hours or as instructed.    MEDICATIONS  You may resume your normal medications.   WHAT YOU CAN EXPECT TODAY  Some feelings of bloating in the abdomen.   Passage of more gas than usual.   Spotting of blood in your stool or on the toilet paper  .  IF YOU HAD POLYPS REMOVED DURING THE ENDOSCOPY:  Eat a soft diet IF YOU HAVE NAUSEA, BLOATING, ABDOMINAL PAIN, OR VOMITING.    FINDING OUT THE RESULTS OF YOUR TEST Not all test results are available during your visit. DR. Darrick Penna WILL CALL YOU WITHIN 14 DAYS OF YOUR PROCEDUE WITH YOUR RESULTS. Do not assume everything is normal if you have not heard from DR. Alonni Heimsoth, CALL HER OFFICE AT [phone].  SEEK IMMEDIATE MEDICAL ATTENTION AND CALL THE OFFICE: [phone] IF:  You have more than a spotting of blood in your stool.   Your belly is swollen (abdominal distention).   You are nauseated or vomiting.   You have a temperature over 101F.   You have abdominal pain or discomfort that is severe or gets worse throughout the day.   Gastritis  Gastritis is an inflammation (the body's way of reacting to injury and/or infection) of the stomach. It is  often caused by bacterial (germ) infections. It can also be caused BY ASPIRIN, BC/GOODY POWDER'S, (IBUPROFEN) MOTRIN, OR ALEVE (NAPROXEN), chemicals (including alcohol), SPICY FOODS, and medications. This illness may be associated with generalized malaise (feeling tired, not well), UPPER ABDOMINAL STOMACH cramps, and fever. One common bacterial cause of gastritis is an organism known as H. Pylori. This can be treated with antibiotics.    High-Fiber Diet A high-fiber diet changes your normal diet to include more whole grains, legumes, fruits, and vegetables. Changes in the diet involve  replacing refined carbohydrates with unrefined foods. The calorie level of the diet is essentially unchanged. The Dietary Reference Intake (recommended amount) for adult males is 38 grams per day. For adult females, it is 25 grams per day. Pregnant and lactating women should consume 28 grams of fiber per day. Fiber is the intact part of a plant that is not broken down during digestion. Functional fiber is fiber that has been isolated from the plant to provide a beneficial effect in the body. PURPOSE  Increase stool bulk.   Ease and regulate bowel movements.   Lower cholesterol.  REDUCE RISK OF COLON CANCER  INDICATIONS THAT YOU NEED MORE FIBER  Constipation and hemorrhoids.   Uncomplicated diverticulosis (intestine condition) and irritable bowel syndrome.   Weight management.   As a protective measure against hardening of the arteries (atherosclerosis), diabetes, and cancer.   GUIDELINES FOR INCREASING FIBER IN THE DIET  Start adding fiber to the diet slowly. A gradual increase of about 5 more grams (2 slices of whole-wheat bread, 2 servings of most fruits or vegetables, or 1 bowl of high-fiber cereal) per day is best. Too rapid an increase in fiber may result in constipation, flatulence, and bloating.   Drink enough water and fluids to keep your urine clear or pale yellow. Water, juice, or caffeine-free drinks are recommended. Not drinking enough fluid may cause constipation.   Eat a variety of high-fiber foods rather than one type of fiber.   Try to increase your intake of fiber through using high-fiber foods rather than fiber pills or supplements that contain small amounts of fiber.   The goal is to change the types of food eaten. Do not supplement your present diet with high-fiber foods, but replace foods in your present diet.    INCLUDE A VARIETY OF FIBER SOURCES  Replace refined and processed grains with whole grains, canned fruits with fresh fruits, and incorporate other  fiber sources. White rice, white breads, and most bakery goods contain little or no fiber.   Brown whole-grain rice, buckwheat oats, and many fruits and vegetables are all good sources of fiber. These include: broccoli, Brussels sprouts, cabbage, cauliflower, beets, sweet potatoes, white potatoes (skin on), carrots, tomatoes, eggplant, squash, berries, fresh fruits, and dried fruits.   Cereals appear to be the richest source of fiber. Cereal fiber is found in whole grains and bran. Bran is the fiber-rich outer coat of cereal grain, which is largely removed in refining. In whole-grain cereals, the bran remains. In breakfast cereals, the largest amount of fiber is found in those with "bran" in their names. The fiber content is sometimes indicated on the label.   You may need to include additional fruits and vegetables each day.   In baking, for 1 cup white flour, you may use the following substitutions:   1 cup whole-wheat flour minus 2 tablespoons.   1/2 cup white flour plus 1/2 cup whole-wheat flour.   Low-Fat Diet BREADS, CEREALS, PASTA, RICE,  DRIED PEAS, AND BEANS These products are high in carbohydrates and most are low in fat. Therefore, they can be increased in the diet as substitutes for fatty foods. They too, however, contain calories and should not be eaten in excess. Cereals can be eaten for snacks as well as for breakfast.  Include foods that contain fiber (fruits, vegetables, whole grains, and legumes). Research shows that fiber may lower blood cholesterol levels, especially the water-soluble fiber found in fruits, vegetables, oat products, and legumes. FRUITS AND VEGETABLES It is good to eat fruits and vegetables. Besides being sources of fiber, both are rich in vitamins and some minerals. They help you get the daily allowances of these nutrients. Fruits and vegetables can be used for snacks and desserts. MEATS Limit lean meat, chicken, [place], and fish to no more than 6 ounces per  day. Beef, Pork, and Lamb Use lean cuts of beef, pork, and lamb. Lean cuts include:  Extra-lean ground beef.  Arm roast.  Sirloin tip.  Center-cut ham.  Round steak.  Loin chops.  Rump roast.  Tenderloin.  Trim all fat off the outside of meats before cooking. It is not necessary to severely decrease the intake of red meat, but lean choices should be made. Lean meat is rich in protein and contains a highly absorbable form of iron. Premenopausal women, in particular, should avoid reducing lean red meat because this could increase the risk for low red blood cells (iron-deficiency anemia). The organ meats, such as liver, sweetbreads, kidneys, and brain are very rich in cholesterol. They should be limited. Chicken and [place] These are good sources of protein. The fat of poultry can be reduced by removing the skin and underlying fat layers before cooking. Chicken and [place] can be substituted for lean red meat in the diet. Poultry should not be fried or covered with high-fat sauces. Fish and Shellfish Fish is a good source of protein. Shellfish contain cholesterol, but they usually are low in saturated fatty acids. The preparation of fish is important. Like chicken and [place], they should not be fried or covered with high-fat sauces. EGGS Egg whites contain no fat or cholesterol. They can be eaten often. Try 1 to 2 egg whites instead of whole eggs in recipes or use egg substitutes that do not contain yolk. MILK AND DAIRY PRODUCTS Use skim or 1% milk instead of 2% or whole milk. Decrease whole milk, natural, and processed cheeses. Use nonfat or low-fat (2%) cottage cheese or low-fat cheeses made from vegetable oils. Choose nonfat or low-fat (1 to 2%) yogurt. Experiment with evaporated skim milk in recipes that call for heavy cream. Substitute low-fat yogurt or low-fat cottage cheese for sour cream in dips and salad dressings. Have at least 2 servings of low-fat dairy products, such as 2 glasses of skim  (or 1%) milk each day to help get your daily calcium intake.  FATS AND OILS Reduce the total intake of fats, especially saturated fat. Butterfat, lard, and beef fats are high in saturated fat and cholesterol. These should be avoided as much as possible. Vegetable fats do not contain cholesterol, but certain vegetable fats, such as coconut oil, palm oil, and palm kernel oil are very high in saturated fats. These should be limited. These fats are often used in bakery goods, processed foods, popcorn, oils, and nondairy creamers. Vegetable shortenings and some peanut butters contain hydrogenated oils, which are also saturated fats. Read the labels on these foods and check for saturated vegetable oils. Unsaturated vegetable  oils and fats do not raise blood cholesterol. However, they should be limited because they are fats and are high in calories. Total fat should still be limited to 30% of your daily caloric intake. Desirable liquid vegetable oils are corn oil, cottonseed oil, olive oil, canola oil, safflower oil, soybean oil, and sunflower oil. Peanut oil is not as good, but small amounts are acceptable. Buy a heart-healthy tub margarine that has no partially hydrogenated oils in the ingredients. Mayonnaise and salad dressings often are made from unsaturated fats, but they should also be limited because of their high calorie and fat content. Seeds, nuts, peanut butter, olives, and avocados are high in fat, but the fat is mainly the unsaturated type. These foods should be limited mainly to avoid excess calories and fat. OTHER EATING TIPS Snacks  Most sweets should be limited as snacks. They tend to be rich in calories and fats, and their caloric content outweighs their nutritional value. Some good choices in snacks are graham crackers, melba toast, soda crackers, bagels (no egg), English muffins, fruits, and vegetables. These snacks are preferable to snack crackers, [place] fries, and chips. Popcorn should be  air-popped or cooked in small amounts of liquid vegetable oil. Desserts Eat fruit, low-fat yogurt, and fruit ices. AVOID pastries, cake, and cookies. Sherbet, angel food cake, gelatin dessert, frozen low-fat yogurt, or other frozen products that do not contain saturated fat (pure fruit juice bars, frozen ice pops) are also acceptable.  COOKING METHODS Choose those methods that use little or no fat. They include: Poaching.  Braising.  Steaming.  Grilling.  Baking.  Stir-frying.  Broiling.  Microwaving.  Foods can be cooked in a nonstick pan without added fat, or use a nonfat cooking spray in regular cookware. Limit fried foods and avoid frying in saturated fat. Add moisture to lean meats by using water, broth, cooking wines, and other nonfat or low-fat sauces along with the cooking methods mentioned above. Soups and stews should be chilled after cooking. The fat that forms on top after a few hours in the refrigerator should be skimmed off. When preparing meals, avoid using excess salt. Salt can contribute to raising blood pressure in some people. EATING AWAY FROM HOME Order entres, potatoes, and vegetables without sauces or butter. When meat exceeds the size of a deck of cards (3 to 4 ounces), the rest can be taken home for another meal. Choose vegetable or fruit salads and ask for low-calorie salad dressings to be served on the side. Use dressings sparingly. Limit high-fat toppings, such as bacon, crumbled eggs, cheese, sunflower seeds, and olives. Ask for heart-healthy tub margarine instead of butter.  Diverticulosis Diverticulosis is a common condition that develops when small pouches (diverticula) form in the wall of the colon OR SMALL BOWEL. The risk of diverticulosis increases with age. It happens more often in people who eat a low-fiber diet. Most individuals with diverticulosis have no symptoms. Those individuals with symptoms usually experience belly (abdominal) pain, constipation, or  loose stools (diarrhea).  HOME CARE INSTRUCTIONS Increase the amount of fiber in your diet as directed by your caregiver or dietician. This may reduce symptoms of diverticulosis.  Drink at least 6 to 8 glasses of water each day to prevent constipation.  Try not to strain when you have a bowel movement.  Avoiding nuts and seeds to prevent complications is NOT NECESSARY.   FOODS HAVING HIGH FIBER CONTENT INCLUDE: Fruits. Apple, peach, pear, tangerine, raisins, prunes.  Vegetables. Brussels sprouts, asparagus, broccoli, cabbage,  carrot, cauliflower, romaine lettuce, spinach, summer squash, tomato, winter squash, zucchini.  Starchy Vegetables. Baked beans, kidney beans, lima beans, split peas, lentils, potatoes (with skin).  Grains. Whole wheat bread, brown rice, bran flake cereal, plain oatmeal, white rice, shredded wheat, bran muffins.   SEEK IMMEDIATE MEDICAL CARE IF: You develop increasing pain or severe bloating.  You have an oral temperature above 101F.  You develop vomiting or bowel movements that are bloody or black.

## 2016-11-24 ENCOUNTER — Telehealth: Payer: Self-pay | Admitting: Gastroenterology

## 2016-11-24 ENCOUNTER — Other Ambulatory Visit: Payer: Self-pay

## 2016-11-24 DIAGNOSIS — R945 Abnormal results of liver function studies: Secondary | ICD-10-CM

## 2016-11-24 DIAGNOSIS — R7989 Other specified abnormal findings of blood chemistry: Secondary | ICD-10-CM

## 2016-11-24 NOTE — Telephone Encounter (Signed)
Per Dr. Darrick PennaFields, OK to give pt a note for tonight. I called and informed pt's wife that I am printing the letter and [facility]

## 2016-11-24 NOTE — Telephone Encounter (Signed)
Pt had procedure yesterday and is still cramping. He is asking for a work note for tonight and wanted us to send it via [facility]. Please advise [phone]

## 2016-11-25 ENCOUNTER — Encounter (HOSPITAL_COMMUNITY): Payer: Self-pay | Admitting: Gastroenterology

## 2016-11-25 NOTE — Telephone Encounter (Signed)
REVIEWED. AGREE. NO ADDITIONAL RECOMMENDATIONS. 

## 2016-11-28 ENCOUNTER — Encounter (HOSPITAL_COMMUNITY): Payer: Self-pay | Admitting: Gastroenterology

## 2016-11-28 ENCOUNTER — Telehealth: Payer: Self-pay | Admitting: Gastroenterology

## 2016-11-28 NOTE — Telephone Encounter (Signed)
Please call pt. His stomach Bx shows gastritis from aspirin.  HIS UPPER SMALL BOWEL BIOPSIES ARE NORMAL.  HIS LOWER SMALL BOWEL AND COLON BIOPSIES SHOW INFLAMMATION WHICH MAY BE DUE TO ASPIRIN. STOP ASPIRIN FOR 3 MOS.    CONTNUE CREON 1-2 WITH MEALS AND WITH SNACKS.  DRINK WATER TO KEEP YOUR URINE LIGHT YELLOW.   FOLLOW A LOW FAT/HIGH FIBER DIET. AVOID ITEMS THAT CAUSE BLOATING.   CONTINUE YOUR WEIGHT LOSS EFFORTS.  CONTINUE PROTONIX. TAKE 30 MINUTES PRIOR TO BREAKFAST.  PLEASE CALL IN ONE MONTH IF YOUR DIARRHEA IS NOT BETTER.  FOLLOW UP IN 4 MOS E30 DIARRHEA/ABDOMINAL PAIN.

## 2016-11-29 NOTE — Telephone Encounter (Signed)
PATIENT SCHEDULED FOR FOLLOW UP 

## 2016-11-29 NOTE — Telephone Encounter (Signed)
Pt's wife, Marylene Landngela, is aware of results.

## 2016-12-29 ENCOUNTER — Other Ambulatory Visit: Payer: Self-pay

## 2016-12-29 MED ORDER — PANTOPRAZOLE SODIUM 40 MG PO TBEC: 40.0000 mg | DELAYED_RELEASE_TABLET | Freq: Every day | ORAL | 3 refills | Status: DC

## 2017-01-05 ENCOUNTER — Telehealth: Payer: Self-pay | Admitting: Gastroenterology

## 2017-01-05 NOTE — Telephone Encounter (Signed)
[phone] PATIENT INSURANCE REQUIRES AUTH TO CHANGE IT FROM PHARMACY TO MAIL ORDER.  WIFE CALLED AND STATED THAT HE IS ALSO OUT OF HIS MEDICATION

## 2017-01-06 MED ORDER — PANTOPRAZOLE SODIUM 40 MG PO TBEC: 40.0000 mg | DELAYED_RELEASE_TABLET | Freq: Every day | ORAL | 3 refills | Status: DC

## 2017-01-06 NOTE — Addendum Note (Signed)
Addended by: Tiffany KocherLEWIS, Gertrude Bucks S on: 01/06/2017 05:24 PM   Modules accepted: Orders

## 2017-01-06 NOTE — Telephone Encounter (Signed)
rx done

## 2017-01-06 NOTE — Telephone Encounter (Signed)
Pt [facility]

## 2017-01-10 ENCOUNTER — Other Ambulatory Visit: Payer: Self-pay

## 2017-01-10 DIAGNOSIS — R7989 Other specified abnormal findings of blood chemistry: Secondary | ICD-10-CM

## 2017-01-10 DIAGNOSIS — R945 Abnormal results of liver function studies: Secondary | ICD-10-CM

## 2017-03-05 LAB — HEPATIC FUNCTION PANEL
ALT: 40 U/L (ref 9–46)
AST: 40 U/L — ABNORMAL HIGH (ref 10–35)
Albumin: 4.4 g/dL (ref 3.6–5.1)
Alkaline Phosphatase: 52 U/L (ref 40–115)
BILIRUBIN INDIRECT: 0.4 mg/dL (ref 0.2–1.2)
Bilirubin, Direct: 0.1 mg/dL (ref <=0.2)
Total Bilirubin: 0.5 mg/dL (ref 0.2–1.2)
Total Protein: 7.2 g/dL (ref 6.1–8.1)

## 2017-03-10 NOTE — Progress Notes (Signed)
LFTs improved. Near normal.   Instructions for fatty liver: Recommend 1-2# weight loss per week until ideal body weight through exercise & diet. Low fat/cholesterol diet.   Avoid sweets, sodas, fruit juices, sweetened beverages like tea, etc. Gradually increase exercise from 15 min daily up to 1 hr per day 5 days/week. Limit alcohol use.  Repeat LFTs in 6 months.

## 2017-03-11 ENCOUNTER — Other Ambulatory Visit: Payer: Self-pay

## 2017-03-11 DIAGNOSIS — R7989 Other specified abnormal findings of blood chemistry: Secondary | ICD-10-CM

## 2017-03-11 DIAGNOSIS — R945 Abnormal results of liver function studies: Secondary | ICD-10-CM

## 2017-03-11 NOTE — Progress Notes (Signed)
PT is aware and lab order on file for 6 months.

## 2017-03-23 ENCOUNTER — Ambulatory Visit: Payer: BLUE CROSS/BLUE SHIELD | Admitting: Gastroenterology

## 2017-04-22 ENCOUNTER — Ambulatory Visit: Payer: BLUE CROSS/BLUE SHIELD | Admitting: Gastroenterology

## 2017-06-16 ENCOUNTER — Ambulatory Visit (INDEPENDENT_AMBULATORY_CARE_PROVIDER_SITE_OTHER): Payer: BLUE CROSS/BLUE SHIELD | Admitting: Gastroenterology

## 2017-06-16 ENCOUNTER — Encounter: Payer: Self-pay | Admitting: Gastroenterology

## 2017-06-16 VITALS — BP 133/84 | HR 79 | Temp 98.2°F | Ht 66.0 in | Wt 245.0 lb

## 2017-06-16 DIAGNOSIS — K529 Noninfective gastroenteritis and colitis, unspecified: Secondary | ICD-10-CM | POA: Diagnosis not present

## 2017-06-16 DIAGNOSIS — R945 Abnormal results of liver function studies: Secondary | ICD-10-CM | POA: Diagnosis not present

## 2017-06-16 DIAGNOSIS — R7989 Other specified abnormal findings of blood chemistry: Secondary | ICD-10-CM

## 2017-06-16 DIAGNOSIS — R300 Dysuria: Secondary | ICD-10-CM

## 2017-06-16 NOTE — Progress Notes (Signed)
Primary Care Physician: Donnita Fallsodriguez, Wendy, MD  Primary Gastroenterologist:  Jonette EvaSandi Fields, MD   Chief Complaint  Patient presents with  . Abdominal Pain    cramping  . Diarrhea    occ    HPI: Jeremiah Zamora is a 51 y.o. male here for follow up. EGD/TCS 11/2016 for chronic diarrhea, new onset heartburn. Few discontinuous areas of nonbleeding ulcerated mucosa with no stigmata of recent bleeding were present in the descending colon and in the cecum, also few one mm ulcers seen in the distal ileum. Bx from right colon with acute active colitis, acute active ileitis ?medication induced, mild microscopic lymphocytic colitis, ischemia or idiopathic IBD, left colon bx with erosive ulcer with reactive changes. From EGD, chronic active gastritis, no h.pylori, small bowel biopsy negative.   Patient started on Creon since last OV given h/o partial pancreatectomy and chronic diarrhea. Also stopped his ASA per Dr. Darrick PennaFields. Diarrhea resolved. Had been doing well up until 2 weeks ago. Complains of dull cramps in bottom of stomach like he has been holding his urine too long. Forces urination and gets from relief. Feels like emptying bladder well but has to urinate every 2 hours and 1-2 times during the night. No melena, brbpr. Heartburn well controlled.   Patient also has h/o abnormal LFTs.  Repeat in 02/2017 with normal ALT but AST mildly elevated at 40. Hep B and C negative. Normal iron, sat%, celiac screen negative.    Current Outpatient Prescriptions  Medication Sig Dispense Refill  . acetaminophen (TYLENOL) 500 MG tablet Take 1,000-1,500 mg by mouth every 6 (six) hours as needed (for pain.).    Marland Kitchen. Armodafinil (NUVIGIL) 250 MG tablet Take 250 mg by mouth daily.    . fenofibrate 160 MG tablet Take 160 mg by mouth at bedtime.     Marland Kitchen. levothyroxine (SYNTHROID, LEVOTHROID) 25 MCG tablet Take 25 mcg by mouth daily before breakfast.     . lipase/protease/amylase (CREON) 36000 UNITS CPEP capsule USE 2 OR 4  WITH MEALS AND SNACKS 360 capsule 11  . lisinopril-hydrochlorothiazide (PRINZIDE,ZESTORETIC) 10-12.5 MG tablet Take 1 tablet by mouth at bedtime.     . Multiple Vitamin (MULTIVITAMIN WITH MINERALS) TABS tablet Take 1 tablet by mouth daily. Men's One A Day    . Omega-3 Fatty Acids (OMEGA-3 FISH OIL) 300 MG CAPS Take 600 mg by mouth 2 (two) times daily.    . ondansetron (ZOFRAN) 4 MG tablet Take 1 tablet (4 mg total) by mouth every 6 (six) hours. (Patient taking differently: Take 4 mg by mouth every 6 (six) hours as needed for nausea. ) 12 tablet 0  . pantoprazole (PROTONIX) 40 MG tablet Take 1 tablet (40 mg total) by mouth daily before breakfast. 90 tablet 3  . pravastatin (PRAVACHOL) 40 MG tablet Take 80 mg by mouth at bedtime.     . sertraline (ZOLOFT) 50 MG tablet Take 50 mg by mouth at bedtime.     Marland Kitchen. testosterone cypionate (DEPOTESTOSTERONE CYPIONATE) 200 MG/ML injection Inject 200 mg into the muscle every 14 (fourteen) days.      No current facility-administered medications for this visit.     Allergies as of 06/16/2017  . (No Known Allergies)    ROS:  General: Negative for anorexia, weight loss, fever, chills, fatigue, weakness. ENT: Negative for hoarseness, difficulty swallowing , nasal congestion. CV: Negative for chest pain, angina, palpitations, dyspnea on exertion, peripheral edema.  Respiratory: Negative for dyspnea at rest, dyspnea on exertion, cough, sputum,  wheezing.  GI: See history of present illness. GU:  Negative for dysuria, hematuria, urinary incontinence, urinary frequency, nocturnal urination.  Endo: Negative for unusual weight change.    Physical Examination:   BP 133/84   Pulse 79   Temp 98.2 F (36.8 C) (Oral)   Ht 5\' 6"  (1.676 m)   Wt 245 lb (111.1 kg)   BMI 39.54 kg/m   General: Well-nourished, well-developed in no acute distress.  Eyes: No icterus. Mouth: Oropharyngeal mucosa moist and pink , no lesions erythema or exudate. Lungs: Clear to  auscultation bilaterally.  Heart: Regular rate and rhythm, no murmurs rubs or gallops.  Abdomen: Bowel sounds are normal, nontender, nondistended, no hepatosplenomegaly or masses, no abdominal bruits or hernia , no rebound or guarding.   Extremities: No lower extremity edema. No clubbing or deformities. Neuro: Alert and oriented x 4   Skin: Warm and dry, no jaundice.   Psych: Alert and cooperative, normal mood and affect.  Imaging Studies: No results found.

## 2017-06-16 NOTE — Patient Instructions (Signed)
1. Please have your urine test done. We will contact you with results as available.  2. I will find out if Dr. Darrick PennaFields wants you to continue holding the aspirin and let you know.

## 2017-06-17 LAB — URINALYSIS W MICROSCOPIC + REFLEX CULTURE
BACTERIA UA: NONE SEEN [HPF]
BILIRUBIN URINE: NEGATIVE
Casts: NONE SEEN [LPF]
Crystals: NONE SEEN [HPF]
GLUCOSE, UA: NEGATIVE
HGB URINE DIPSTICK: NEGATIVE
Ketones, ur: NEGATIVE
LEUKOCYTES UA: NEGATIVE
NITRITE: NEGATIVE
PH: 6.5 (ref 5.0–8.0)
Protein, ur: NEGATIVE
RBC / HPF: NONE SEEN RBC/HPF (ref <=2)
SQUAMOUS EPITHELIAL / LPF: NONE SEEN [HPF] (ref <=5)
Specific Gravity, Urine: 1.004 (ref 1.001–1.035)
WBC UA: NONE SEEN WBC/HPF (ref <=5)
YEAST: NONE SEEN [HPF]

## 2017-06-19 NOTE — Progress Notes (Signed)
Please let patient know that his U/A specimen was completely normal.  If he continues to have lower abdominal cramping and urinary concerns, see PCP. If abd pain without urinary concerns, diarrhea, etc, let us know.  Return to see [facility] in 6 months.

## 2017-06-19 NOTE — Assessment & Plan Note (Signed)
Check u/s with culture reflex.

## 2017-06-19 NOTE — Assessment & Plan Note (Signed)
Improved. Continue to monitor. 

## 2017-06-19 NOTE — Assessment & Plan Note (Signed)
Resolved. Doing well on Creon. ASA held due to ?medication effect on TI/colon as outlined above. Clinically patient doing well. ASA started due to FH of CAD/CVA.

## 2017-06-21 NOTE — Progress Notes (Signed)
PT said he is having abdominal pain in his lower abdomen. Also, watery diarrhea 2-3 times yesterday. Please advise!

## 2017-06-22 NOTE — Progress Notes (Signed)
cc'ed to pcp °

## 2017-06-23 NOTE — Progress Notes (Signed)
Jeremiah Zamora, did I send this back to you?

## 2017-06-26 NOTE — Progress Notes (Signed)
Please have patient get CRP, ESR done.  Try bentyl 78m before breakfast and before evening meal as needed for abd cramping. #60, 1 RF.

## 2017-06-27 ENCOUNTER — Other Ambulatory Visit: Payer: Self-pay

## 2017-06-27 DIAGNOSIS — R109 Unspecified abdominal pain: Secondary | ICD-10-CM

## 2017-06-27 NOTE — Progress Notes (Signed)
LMOM for a return call. Lab orders entered and released.

## 2017-06-28 NOTE — Progress Notes (Signed)
Unable to reach pt by phone. [facility] and to call or send message which pharmacy to send the medication to.

## 2017-06-29 NOTE — Progress Notes (Signed)
Pt has not acknowledged MY CHART message. Mailing a letter for him to call.

## 2017-07-07 ENCOUNTER — Encounter: Payer: Self-pay | Admitting: Gastroenterology

## 2017-07-08 ENCOUNTER — Telehealth: Payer: Self-pay

## 2017-07-08 MED ORDER — DICYCLOMINE HCL 10 MG PO CAPS: 10.0000 mg | ORAL_CAPSULE | ORAL | 1 refills | Status: DC | PRN

## 2017-07-08 NOTE — Telephone Encounter (Signed)
Has been faxed.

## 2017-07-08 NOTE — Telephone Encounter (Signed)
Jeremiah Zamora, I am forwarding you the email received from this pt.  I have updated his phone number and pharmacy as requested.

## 2017-07-08 NOTE — Telephone Encounter (Signed)
Doris, I tried to send in the Bentyl but would not escribe with [facility]. Please fax.

## 2017-07-09 LAB — C-REACTIVE PROTEIN: CRP: 4.8 mg/L (ref <8.0)

## 2017-07-09 LAB — SEDIMENTATION RATE: SED RATE: 9 mm/h (ref 0–20)

## 2017-07-25 ENCOUNTER — Encounter: Payer: Self-pay | Admitting: Gastroenterology

## 2017-07-25 NOTE — Progress Notes (Signed)
PT's wife Marylene Land is aware. He did get his Bentyl. Ok to schedule OV with SF.

## 2017-07-25 NOTE — Progress Notes (Signed)
To Misty Stanley to schedule.

## 2017-07-25 NOTE — Progress Notes (Signed)
PATIENT SCHEDULED  °

## 2017-07-25 NOTE — Progress Notes (Signed)
Inflammatory markers negative. Please make f/u appt in 4 weeks with SLF only to see how he is doing. Should have started bentyl as well.

## 2017-09-01 ENCOUNTER — Ambulatory Visit: Payer: BLUE CROSS/BLUE SHIELD | Admitting: Gastroenterology

## 2017-09-21 ENCOUNTER — Ambulatory Visit: Payer: BLUE CROSS/BLUE SHIELD | Admitting: Gastroenterology

## 2017-09-27 NOTE — Progress Notes (Signed)
Sorry for delay. Please let patient know that SLF had help ASA only for 3 months back in 11/2016. He should resume if desire for cardiopulmonary/stroke prevention. Keep upcoming ov with SLF.

## 2017-09-28 NOTE — Progress Notes (Signed)
Pt's wife, Marylene Landngela, is aware and said he had not restarted the ASA but she will let him know that he can.

## 2017-11-09 ENCOUNTER — Ambulatory Visit: Payer: BLUE CROSS/BLUE SHIELD | Admitting: Gastroenterology

## 2017-12-10 ENCOUNTER — Other Ambulatory Visit: Payer: Self-pay | Admitting: Gastroenterology

## 2017-12-14 ENCOUNTER — Telehealth: Payer: Self-pay | Admitting: Gastroenterology

## 2017-12-14 NOTE — Telephone Encounter (Signed)
PT is aware it is in the refill and I will let them know it has not been sent in.

## 2017-12-14 NOTE — Telephone Encounter (Signed)
Pt's wife called to say that patient's creon prescription hasn't been called into [facility]. [phone]

## 2017-12-22 ENCOUNTER — Encounter: Payer: Self-pay | Admitting: Gastroenterology

## 2017-12-22 ENCOUNTER — Ambulatory Visit: Payer: BLUE CROSS/BLUE SHIELD | Admitting: Gastroenterology

## 2017-12-22 ENCOUNTER — Telehealth: Payer: Self-pay | Admitting: Gastroenterology

## 2017-12-22 NOTE — Telephone Encounter (Signed)
REVIEWED-NO ADDITIONAL RECOMMENDATIONS. 

## 2017-12-22 NOTE — Telephone Encounter (Signed)
PATIENT WAS A NO SHOW AND LETTER SENT  °

## 2018-01-02 ENCOUNTER — Encounter: Payer: Self-pay | Admitting: Gastroenterology

## 2018-01-21 ENCOUNTER — Encounter: Payer: Self-pay | Admitting: Gastroenterology

## 2018-01-23 MED ORDER — DICYCLOMINE HCL 10 MG PO CAPS: ORAL_CAPSULE | ORAL | 3 refills | Status: DC

## 2018-02-16 ENCOUNTER — Ambulatory Visit: Payer: BLUE CROSS/BLUE SHIELD | Admitting: Gastroenterology

## 2018-03-08 ENCOUNTER — Ambulatory Visit: Payer: BLUE CROSS/BLUE SHIELD | Admitting: Gastroenterology

## 2018-03-08 ENCOUNTER — Encounter

## 2018-03-10 ENCOUNTER — Other Ambulatory Visit: Payer: Self-pay | Admitting: Gastroenterology

## 2018-04-05 ENCOUNTER — Ambulatory Visit: Payer: BLUE CROSS/BLUE SHIELD | Admitting: Gastroenterology

## 2018-04-05 ENCOUNTER — Encounter: Payer: Self-pay | Admitting: Gastroenterology

## 2018-04-05 DIAGNOSIS — K219 Gastro-esophageal reflux disease without esophagitis: Secondary | ICD-10-CM

## 2018-04-05 DIAGNOSIS — K8681 Exocrine pancreatic insufficiency: Secondary | ICD-10-CM

## 2018-04-05 MED ORDER — PANTOPRAZOLE SODIUM 40 MG PO TBEC: 40.0000 mg | DELAYED_RELEASE_TABLET | Freq: Two times a day (BID) | ORAL | 3 refills | Status: DC

## 2018-04-05 NOTE — Assessment & Plan Note (Signed)
SYMPTOMS CONTROLLED/RESOLVED AND OCCASIONALLY HAS CONSTIPATION OR LOOSE STOOLS.   USE BENTYL PRN FOLLOW A LOW FAT DIET CONTINUE CREON FOLLOW UP IN 6 MOS.

## 2018-04-05 NOTE — Progress Notes (Signed)
CC'D TO PCP °
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
## 2018-04-05 NOTE — Progress Notes (Signed)
ON RECALL  °

## 2018-04-05 NOTE — Assessment & Plan Note (Signed)
SYMPTOMS NOT CONTROLLED AND DUE TO DIETARY NON-ADHERENCE/BMI NEAR 40.  To reduce HEARTBURN:    1. AVOID REFLUX TRIGGERS.  HANDOUT GIVEN.    2. DRINK WATER TO KEEP YOUR URINE LIGHT YELLOW.    3. AVOID CAFFEINE IN TEA OR SODAS.    4. CONTINUE YOUR WEIGHT LOSS EFFORTS.    5. CONTINUE PROTONIX. INCREASE TO 30 MINUTES PRIOR TO MEALS TWICE DAILY.  PLEASE CALL IN ONE MONTH IF SYMPTOMS ARE NOT IMPROVED.   FOLLOW UP IN 6 MOS.

## 2018-04-05 NOTE — Patient Instructions (Addendum)
To reduce HEARTBURN:    1. AVOID REFLUX TRIGGERS. SEE INFO BELOW.    2. DRINK WATER TO KEEP YOUR URINE LIGHT YELLOW.    3. AVOID CAFFEINE IN TEA OR SODAS.    4. CONTINUE YOUR WEIGHT LOSS EFFORTS.    5. CONTINUE PROTONIX. INCREASE TO 30 MINUTES PRIOR TO MEALS TWICE DAILY.   TO REDUCE LOOSE STOOLS:    1. CONTINUE CREON.    2. USE DICYLOMINE WHEN NEEDED    3. STRICTLY FOLLOW A LOW FAT DIET. SEE INFO BELOW.    PLEASE CALL IN ONE MONTH IF SYMPTOMS ARE NOT IMPROVED.   FOLLOW UP IN 6 MOS.

## 2018-04-10 ENCOUNTER — Telehealth: Payer: Self-pay

## 2018-04-10 NOTE — Telephone Encounter (Signed)
  Received the PA approval on Pantoprazole 40 mg bid from [employer]. Reference # [id number].  Effective: 04/10/2018-04/10/2019.  Faxing to [facility] in [place].

## 2018-05-12 ENCOUNTER — Encounter: Payer: Self-pay | Admitting: Gastroenterology

## 2018-05-15 MED ORDER — ONDANSETRON HCL 4 MG PO TABS: ORAL_TABLET | ORAL | 1 refills | Status: DC

## 2018-08-15 ENCOUNTER — Encounter: Payer: Self-pay | Admitting: Gastroenterology

## 2018-12-29 ENCOUNTER — Other Ambulatory Visit: Payer: Self-pay | Admitting: Nurse Practitioner

## 2019-01-09 MED ORDER — PANTOPRAZOLE SODIUM 40 MG PO TBEC: 40.0000 mg | DELAYED_RELEASE_TABLET | Freq: Two times a day (BID) | ORAL | 1 refills | Status: DC

## 2019-01-09 NOTE — Telephone Encounter (Signed)
PROTONIX RX SENT FOR 90 DAYS RFX1. PT NEEDS OPV WITHIN THE NEXT 3-4 MOS, DX: GERD.

## 2019-01-12 ENCOUNTER — Encounter: Payer: Self-pay | Admitting: Gastroenterology

## 2019-02-22 ENCOUNTER — Other Ambulatory Visit: Payer: Self-pay

## 2019-02-22 ENCOUNTER — Ambulatory Visit: Payer: BLUE CROSS/BLUE SHIELD | Admitting: Gastroenterology

## 2019-02-22 ENCOUNTER — Encounter: Payer: Self-pay | Admitting: Gastroenterology

## 2019-02-22 ENCOUNTER — Ambulatory Visit (INDEPENDENT_AMBULATORY_CARE_PROVIDER_SITE_OTHER): Payer: BLUE CROSS/BLUE SHIELD | Admitting: Gastroenterology

## 2019-02-22 DIAGNOSIS — K8681 Exocrine pancreatic insufficiency: Secondary | ICD-10-CM

## 2019-02-22 DIAGNOSIS — K219 Gastro-esophageal reflux disease without esophagitis: Secondary | ICD-10-CM

## 2019-02-22 NOTE — Assessment & Plan Note (Signed)
SYMPTOMS FAIRLY WELL CONTROLLED.  DRINK WATER TO KEEP YOUR URINE LIGHT YELLOW. FOLLOW A LOW FAT DIET. MEATS SHOULD BE BAKED, BROILED, OR BOILED. AVOID FRIED FOODS. CONTINUE PROTONIX. TAKE 30 MINUTES PRIOR TO MEALS TWICE DAILY. FOLLOW UP IN 6 MOS.
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
## 2019-02-22 NOTE — Assessment & Plan Note (Signed)
LEADING TO POSTPRANDIAL DIARRHEA. SYMPTOMS NOT IDEALLY CONTROLLED AND LIKELY DUE TO DAIRY AND FAT INTAKE.  DRINK WATER TO KEEP YOUR URINE LIGHT YELLOW. TO HAVE LESS LOOSE STOOLS:   1. CUT OUT OR CUT DOWN THE DAIRY IN YOUR DIET. IF YOU CONSUME DAIRY, ADD LACTASE 3 PILLS WITH MEALS UP TO THREE TIMES A DAY.   2. CONTINUE CREON.   3. FIND FIBER SOURCES THAT DO NOT CAUSE LOOSE STOOL.   4. FOLLOW A LOW FAT DIET. MEATS SHOULD BE BAKED, BROILED, OR BOILED. AVOID FRIED FOODS.   5. USE BENTYL TO CONTROL LOOSE STOOLS WHEN NEEDED. FOLLOW UP IN 6 MOS.

## 2019-02-22 NOTE — Patient Instructions (Addendum)
DRINK WATER TO KEEP YOUR URINE LIGHT YELLOW.  TO HAVE LESS LOOSE STOOLS:    1. CUT OUT OR CUT DOWN THE DAIRY IN YOUR DIET. IF YOU CONSUME DAIRY, ADD LACTASE 3 PILLS WITH MEALS UP TO THREE TIMES A DAY.    2. CONTINUE CREON.    3. FIND FIBER SOURCES THAT DO NOT CAUSE LOOSE STOOL.    4. FOLLOW A LOW FAT DIET. MEATS SHOULD BE BAKED, BROILED, OR BOILED. AVOID FRIED FOODS.    5. USE BENTYL TO CONTROL LOOSE STOOLS WHEN NEEDED.  CONTINUE PROTONIX. TAKE 30 MINUTES PRIOR TO MEALS TWICE DAILY.  FOLLOW UP IN 6 MOS.

## 2019-02-26 NOTE — Progress Notes (Signed)
ON RECALL  °

## 2019-02-26 NOTE — Progress Notes (Signed)
CC'D TO PCP °

## 2019-03-19 ENCOUNTER — Other Ambulatory Visit: Payer: Self-pay | Admitting: Gastroenterology

## 2019-04-12 ENCOUNTER — Ambulatory Visit: Payer: BLUE CROSS/BLUE SHIELD | Admitting: Gastroenterology

## 2019-05-07 ENCOUNTER — Telehealth: Payer: Self-pay

## 2019-05-07 NOTE — Telephone Encounter (Signed)
I tried to email the work note to pt and it would not go, I have faxed to the number he gave [place] [phone].

## 2019-05-07 NOTE — Telephone Encounter (Signed)
SEND WORK EXCUSE VIA EMAIL.

## 2019-05-07 NOTE — Telephone Encounter (Signed)
PT NEEDS WORK EXCUSE FOR JUL 17, 20. RETURN TO WORK JUL 21.

## 2019-07-18 ENCOUNTER — Other Ambulatory Visit: Payer: Self-pay

## 2019-07-19 MED ORDER — PANTOPRAZOLE SODIUM 40 MG PO TBEC: 40.0000 mg | DELAYED_RELEASE_TABLET | Freq: Two times a day (BID) | ORAL | 3 refills | Status: DC

## 2019-08-07 ENCOUNTER — Encounter: Payer: Self-pay | Admitting: Gastroenterology

## 2019-08-09 ENCOUNTER — Telehealth: Payer: Self-pay

## 2019-08-09 ENCOUNTER — Other Ambulatory Visit: Payer: Self-pay | Admitting: *Deleted

## 2019-08-09 MED ORDER — ONDANSETRON HCL 4 MG PO TABS: 4.0000 mg | ORAL_TABLET | Freq: Three times a day (TID) | ORAL | 0 refills | Status: DC

## 2019-08-09 NOTE — Telephone Encounter (Signed)
The prescription for Zofran printed and I have faxed it to pt's pharmacy [facility] in [place] on [address].

## 2019-08-09 NOTE — Addendum Note (Signed)
Addended by: Annitta Needs on: 08/09/2019 11:31 AM   Modules accepted: Orders

## 2019-08-09 NOTE — Telephone Encounter (Signed)
Done

## 2019-08-09 NOTE — Telephone Encounter (Signed)
receieved refill request from pharmacy for ondansetron HCL 4mg  tablet. Take 1 tab po 30 mins before meals and at bedtime as needed. #64 tablets.

## 2019-10-23 ENCOUNTER — Other Ambulatory Visit: Payer: Self-pay | Admitting: Nurse Practitioner

## 2019-11-26 ENCOUNTER — Telehealth: Payer: Self-pay

## 2019-11-26 NOTE — Telephone Encounter (Addendum)
Received a fax from [facility] for pt's meds to be reviewed since he has multiple meds and multiple providers.   Form placed in box for review by Wynne Dust, NP ( it had his name on the form).

## 2019-11-28 NOTE — Telephone Encounter (Signed)
Form completed to be faxed.

## 2019-11-28 NOTE — Telephone Encounter (Signed)
Faxed

## 2019-12-29 ENCOUNTER — Other Ambulatory Visit: Payer: Self-pay | Admitting: Gastroenterology

## 2021-01-22 ENCOUNTER — Ambulatory Visit: Payer: BLUE CROSS/BLUE SHIELD | Admitting: Orthopaedic Surgery

## 2021-01-27 ENCOUNTER — Ambulatory Visit: Payer: 59 | Admitting: Orthopaedic Surgery

## 2021-01-29 ENCOUNTER — Ambulatory Visit: Payer: 59 | Admitting: Orthopaedic Surgery

## 2021-02-03 ENCOUNTER — Other Ambulatory Visit: Payer: Self-pay

## 2021-02-03 ENCOUNTER — Encounter: Payer: Self-pay | Admitting: Orthopaedic Surgery

## 2021-02-03 ENCOUNTER — Ambulatory Visit: Payer: 59 | Admitting: Orthopaedic Surgery

## 2021-02-03 VITALS — BP 135/80 | HR 85 | Ht 66.0 in | Wt 258.0 lb

## 2021-02-03 DIAGNOSIS — M25561 Pain in right knee: Secondary | ICD-10-CM

## 2021-02-03 MED ORDER — NAPROXEN 500 MG PO TABS
500.0000 mg | ORAL_TABLET | Freq: Two times a day (BID) | ORAL | 5 refills | Status: DC
Start: 2021-02-03 — End: 2021-04-30
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
## 2021-02-11 ENCOUNTER — Encounter: Payer: Self-pay | Admitting: Orthopaedic Surgery

## 2021-02-17 ENCOUNTER — Ambulatory Visit: Payer: 59 | Admitting: Orthopaedic Surgery

## 2021-02-24 ENCOUNTER — Other Ambulatory Visit: Payer: Self-pay

## 2021-02-24 ENCOUNTER — Encounter: Payer: Self-pay | Admitting: Orthopaedic Surgery

## 2021-02-24 ENCOUNTER — Ambulatory Visit (INDEPENDENT_AMBULATORY_CARE_PROVIDER_SITE_OTHER): Payer: 59 | Admitting: Orthopaedic Surgery

## 2021-02-24 VITALS — BP 128/79 | HR 82 | Ht 66.0 in | Wt 265.0 lb

## 2021-02-24 DIAGNOSIS — M25561 Pain in right knee: Secondary | ICD-10-CM

## 2021-02-24 NOTE — Progress Notes (Signed)
My knee is better.  He has less swelling and pain of the right knee but still has some giving way. He got a brace and it helps.    NV intact.  He has medial pain and positive medial McMurray.  ROM is 0 to 105.  Encounter Diagnosis  Name Primary?  . Acute pain of right knee Yes   I am concerned about meniscus tear.  They have a very high deductible on insurance.  Continue the Naprosyn.    He may need MRI.  Return in one month.  I told him about [facility].  Call if any problem.  Precautions discussed.   Electronically Signed Darreld Mclean, MD 5/10/202210:06 AM

## 2021-02-24 NOTE — Patient Instructions (Addendum)
Let us know if you want to proceed with the MRI scan to look for a torn cartilage , if you do have a tear the knee may give out, be careful with going down incline, and do not get on a ladder  If the knee locks, that is emergent and you should go to emergency room.   Use Aspercreme, Biofreeze or Voltaren gel over the counter 2-3 times daily make sure you rub it in well each time you use it.   Check with financial assistance at [facility] to see if they can help you with the billing/ high deductible

## 2021-03-03 ENCOUNTER — Telehealth: Payer: Self-pay | Admitting: Orthopaedic Surgery

## 2021-03-03 NOTE — Telephone Encounter (Signed)
Patient called and left message stating that he tried to schedule MRI but was told that he could not do that because there was no order for him for an MRI  Can you check on this for him please?  Thanls

## 2021-03-03 NOTE — Telephone Encounter (Signed)
I called and spoke with the patient and he is aware that per the last note that Dr. Hilda Lias did not order the MRI as of yet per the last note. No other concerns.

## 2021-03-05 ENCOUNTER — Other Ambulatory Visit: Payer: Self-pay | Admitting: Orthopaedic Surgery

## 2021-03-05 DIAGNOSIS — G8929 Other chronic pain: Secondary | ICD-10-CM

## 2021-03-11 NOTE — Telephone Encounter (Signed)
I spoke with patient and he is aware that we are waiting on an approval from his insurance before he will get a call to set up the MRI.

## 2021-03-24 ENCOUNTER — Ambulatory Visit: Payer: 59 | Admitting: Orthopaedic Surgery

## 2021-03-26 ENCOUNTER — Ambulatory Visit (HOSPITAL_COMMUNITY)
Admission: RE | Admit: 2021-03-26 | Discharge: 2021-03-26 | Disposition: A | Discharge status: Home / Self Care | Payer: 59 | Source: Ambulatory Visit | Attending: Orthopaedic Surgery | Admitting: Orthopaedic Surgery

## 2021-03-26 DIAGNOSIS — G8929 Other chronic pain: Secondary | ICD-10-CM | POA: Diagnosis present

## 2021-03-26 DIAGNOSIS — M25561 Pain in right knee: Secondary | ICD-10-CM | POA: Insufficient documentation

## 2021-03-26 IMAGING — MR MR KNEE*R* W/O CM
7 series · 40 of 40 positions shown · non-contrast
Comparison: None.

CLINICAL DATA: Acute onset right knee pain [DATE]. No known
injury.

EXAM:
MRI OF THE RIGHT KNEE WITHOUT CONTRAST
TECHNIQUE: Multiplanar, multisequence MR imaging of the knee was performed. No
intravenous contrast was administered.

[Series 8: T2 fat-sat · axial · right · 4.0mm · 0.50mm/px · z∈[-59,+95]mm · 6 of 32 slices shown (1 of 3)]
[im 1/32]
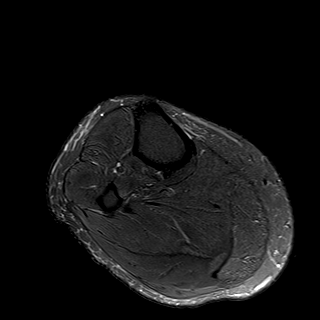
[im 7/32]
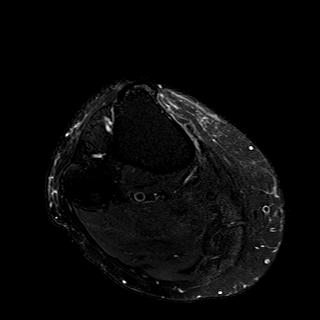
[im 13/32]
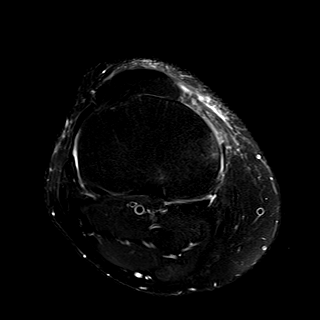
[im 19/32]
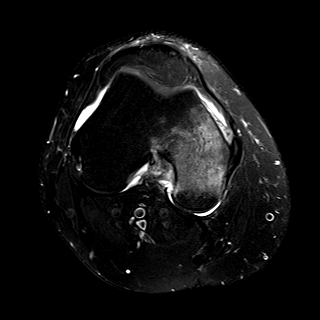
[im 25/32]
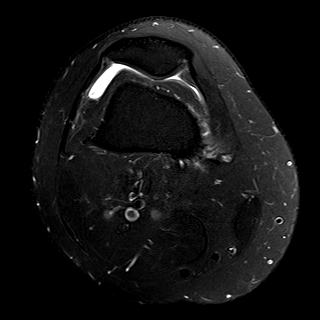
[im 32/32]
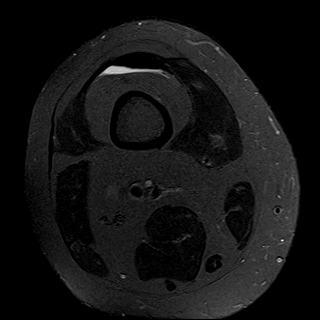

[Series 9: T1 · coronal · right · 4.0mm · 0.59mm/px · 6 of 26 slices shown]
[im 1/26]
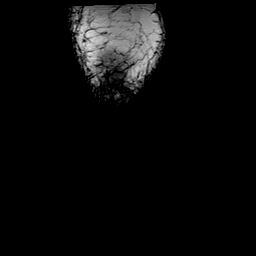
[im 6/26]
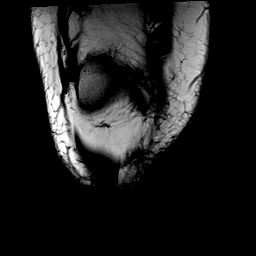
[im 11/26]
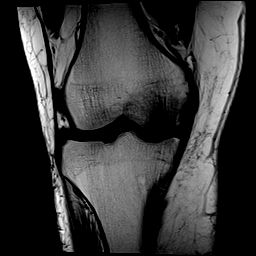
[im 16/26]
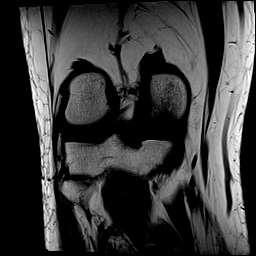
[im 21/26]
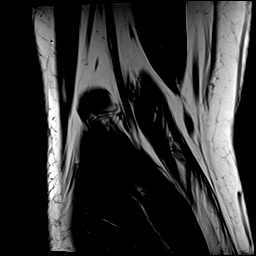
[im 26/26]
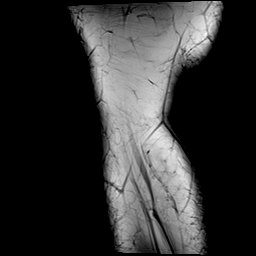

[Series 10: T2 fat-sat · coronal · right · 4.0mm · 0.59mm/px · 6 of 26 slices shown (2 of 3)]
[im 1/26]
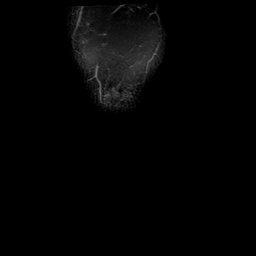
[im 6/26]
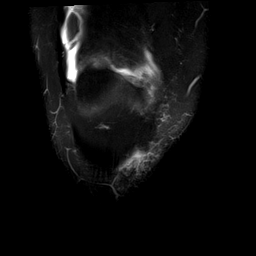
[im 11/26]
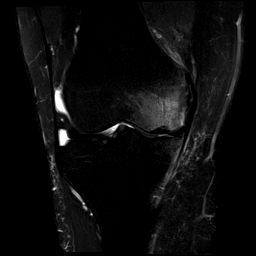
[im 16/26]
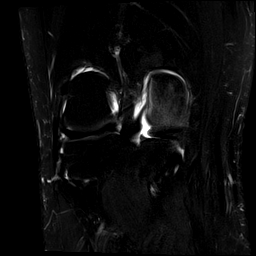
[im 21/26]
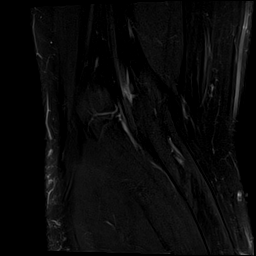
[im 26/26]
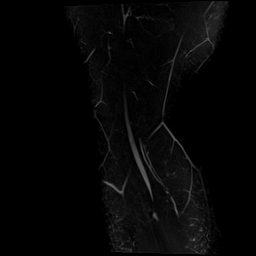

[Series 11: PD fat-sat · coronal · right · 4.0mm · 0.59mm/px · 6 of 26 slices shown (1 of 2)]
[im 1/26]
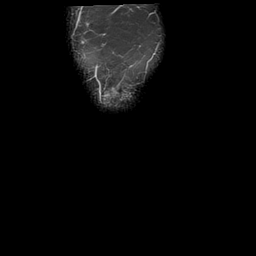
[im 6/26]
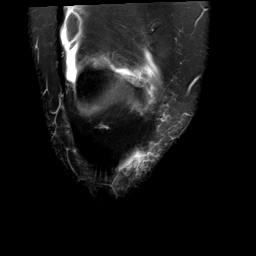
[im 11/26]
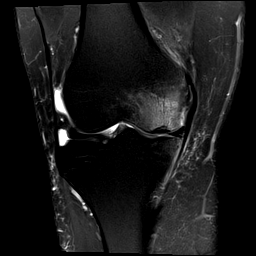
[im 16/26]
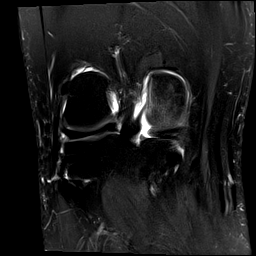
[im 21/26]
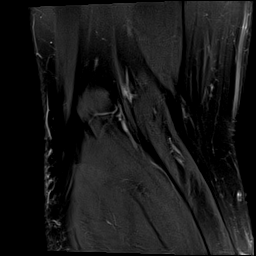
[im 26/26]
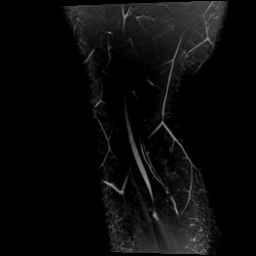

[Series 13: T2 fat-sat · sagittal · right · 3.0mm · 0.62mm/px · 6 of 29 slices shown (3 of 3)]
[im 1/29]
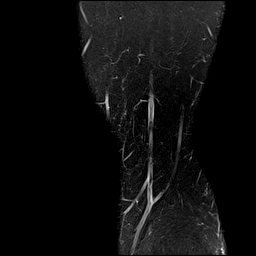
[im 6/29]
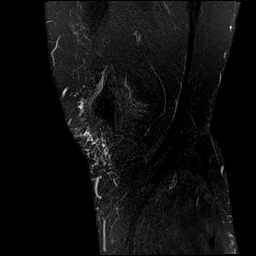
[im 12/29]
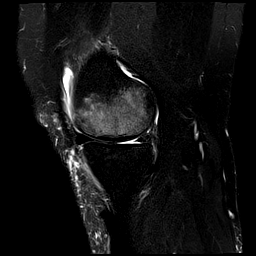
[im 17/29]
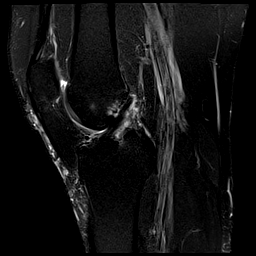
[im 23/29]
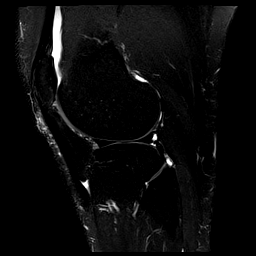
[im 29/29]
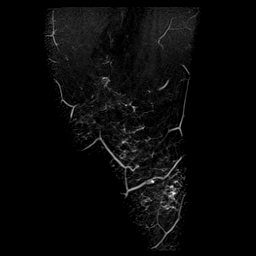

[Series 14: PD · coronal · right · 2.0mm · 0.47mm/px · 4 of 18 slices shown]
[im 1/18]
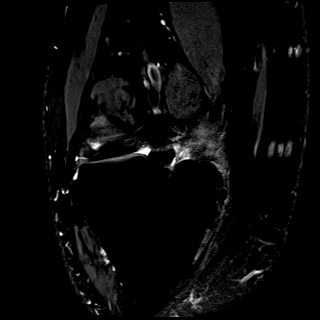
[im 6/18]
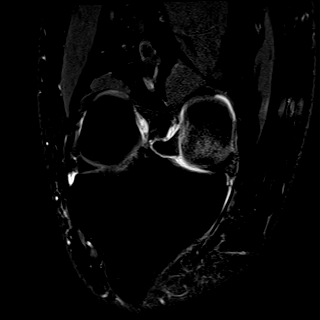
[im 12/18]
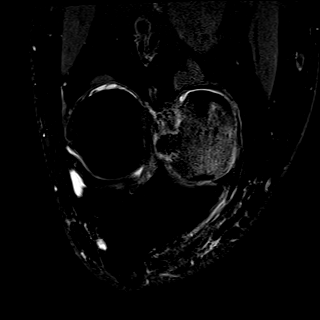
[im 18/18]
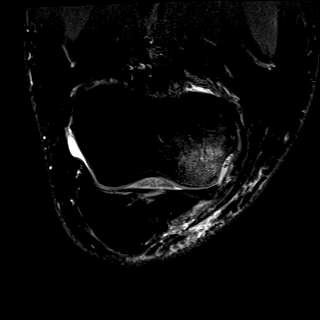

[Series 15: PD fat-sat · sagittal · right · 3.0mm · 0.62mm/px · 6 of 29 slices shown (2 of 2)]
[im 1/29]
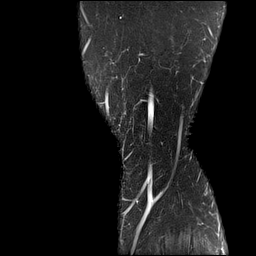
[im 6/29]
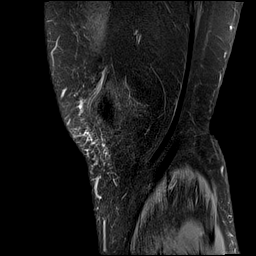
[im 12/29]
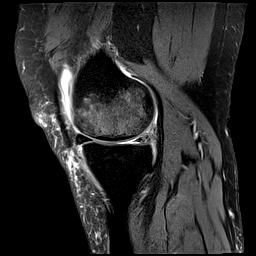
[im 17/29]
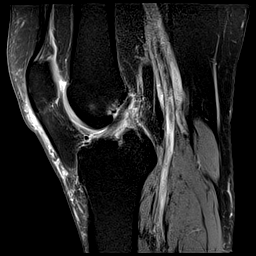
[im 23/29]
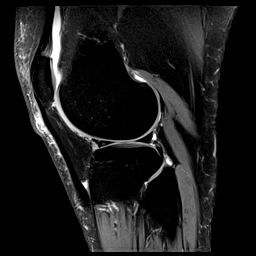
[im 29/29]
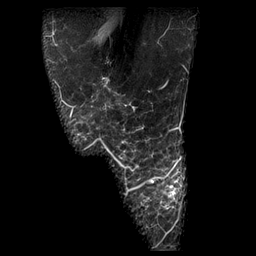

[40 of 40 positions shown; findings below may reference images not displayed]

FINDINGS: MENISCI

Medial meniscus: Complete radial tear through the root of the
posterior horn is identified with peripheral displacement of
approximately 0.6 cm.

Lateral meniscus:  Intact.

LIGAMENTS

Cruciates:  Intact.

Collaterals:  Intact.

CARTILAGE

Patellofemoral: Mildly degenerated, most notable at the apex of the
patella in the lower pole.

Medial:  Mildly degenerated.

Lateral:  Mildly degenerated.

Joint:  Small joint effusion.

Popliteal Fossa:  No Baker's cyst.

Extensor Mechanism:  Intact.

Bones: There is a nondisplaced fracture of the subchondral bone
plate of the weight-bearing medial femoral condyle with associated
marrow edema. Small focus of mild marrow edema in the periphery of
the medial tibia without fracture also noted.

Other: None.
IMPRESSION: Complete radial tear root of the posterior horn of the medial
meniscus with peripheral displacement of approximately 0.6 cm.

Acute or early subacute nondisplaced fracture of the subchondral
bone plate of the weight-bearing medial femoral condyle. Small focus
of mild marrow edema in the periphery of the medial tibia is
compatible with stress change.

Mild osteoarthritis most notable in the patellofemoral compartment.

## 2021-03-31 ENCOUNTER — Ambulatory Visit: Payer: 59 | Admitting: Orthopaedic Surgery

## 2021-03-31 ENCOUNTER — Other Ambulatory Visit: Payer: Self-pay

## 2021-03-31 ENCOUNTER — Encounter: Payer: Self-pay | Admitting: Orthopaedic Surgery

## 2021-03-31 VITALS — BP 124/82 | HR 84 | Ht 66.0 in | Wt 262.6 lb

## 2021-03-31 DIAGNOSIS — S83241A Other tear of medial meniscus, current injury, right knee, initial encounter: Secondary | ICD-10-CM

## 2021-03-31 NOTE — Progress Notes (Signed)
My knee still hurts.  He had MRI of the right knee showing:  IMPRESSION: Complete radial tear root of the posterior horn of the medial meniscus with peripheral displacement of approximately 0.6 cm.   Acute or early subacute nondisplaced fracture of the subchondral bone plate of the weight-bearing medial femoral condyle. Small focus of mild marrow edema in the periphery of the medial tibia is compatible with stress change.   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    I have explained the findings to him.  I will have him see Dr. Romeo Apple or Dr. Dallas Schimke.  I have told him about arthroscopy of the knee.  I have independently reviewed the MRI.    Encounter Diagnosis  Name Primary?   Tear of medial meniscus of right knee, current, initial encounter Yes   Call if any problem.  Precautions discussed.  Electronically Signed Darreld Mclean, MD 6/14/20222:23 PM

## 2021-04-03 ENCOUNTER — Other Ambulatory Visit: Payer: Self-pay

## 2021-04-03 ENCOUNTER — Ambulatory Visit: Payer: 59

## 2021-04-03 ENCOUNTER — Encounter: Payer: Self-pay | Admitting: Orthopedic Surgery

## 2021-04-03 ENCOUNTER — Ambulatory Visit: Payer: 59 | Admitting: Orthopedic Surgery

## 2021-04-03 VITALS — BP 173/92 | HR 84 | Ht 66.0 in | Wt 262.4 lb

## 2021-04-03 DIAGNOSIS — Z6841 Body Mass Index (BMI) 40.0 and over, adult: Secondary | ICD-10-CM

## 2021-04-03 DIAGNOSIS — S83241A Other tear of medial meniscus, current injury, right knee, initial encounter: Secondary | ICD-10-CM | POA: Diagnosis not present
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
## 2021-04-03 NOTE — Patient Instructions (Signed)
We have discussed a right knee meniscus root repair surgery in clinic today.  I would like you to obtain medical clearance for surgery, as soon as possible.  Once this is obtained, we can meet again to discuss proceeding with surgery.    I recommend you remain non-weightbearing, or very limited weight bearing on your right knee prior to surgery.  Please contact the clinic if you have any issues.

## 2021-04-16 NOTE — Telephone Encounter (Signed)
Pt called to see if we'd received his clearance letter. Spoke with pt and discussed information from Dr. Dallas Schimke. Pt states he would like to proceed with surgery as soon as possible. Also let pt know that Dr. Dallas Schimke will be out of the office the week of 7/18, pt verbalized understanding and is still willing to do surgery sooner

## 2021-04-17 ENCOUNTER — Other Ambulatory Visit: Payer: Self-pay

## 2021-04-19 DIAGNOSIS — M65311 Trigger thumb, right thumb: Secondary | ICD-10-CM | POA: Insufficient documentation

## 2021-04-24 NOTE — Patient Instructions (Signed)
Jeremiah MonarchDarrell K Zamora  04/24/2021     @PREFPERIOPPHARMACY @   Your procedure is scheduled on 04/30/2021.   [facility]  0920  A.M.   Call this number if you have problems the morning of surgery:  [phone]   Remember:  Do not eat or drink after midnight.      Take these medicines the morning of surgery with A SIP OF WATER      levothyroxine.      Do not wear jewelry, make-up or nail polish.  Do not wear lotions, powders, or perfumes, or deodorant.  Do not shave 48 hours prior to surgery.  Men may shave face and neck.  Do not bring valuables to the hospital.  [facility] is not responsible for any belongings or valuables.  Contacts, dentures or bridgework may not be worn into surgery.  Leave your suitcase in the car.  After surgery it may be brought to your room.  For patients admitted to the hospital, discharge time will be determined by your treatment team.  Patients discharged the day of surgery will not be allowed to drive home and must have someone with them for 24 hours.    Special instructions:    DO NOT smoke tobacco or vape for 24 hours before your procedure.  Please read over the following fact sheets that you were given. Coughing and Deep Breathing, Surgical Site Infection Prevention, Anesthesia Post-op Instructions, and Care and Recovery After Surgery      Arthroscopic Knee Ligament Repair, Care After This sheet gives you information about how to care for yourself after your procedure. Your health care provider may also give you more specific instructions. If you have problems or questions, contact your health careprovider. What can I expect after the procedure? After the procedure, it is common to have: Soreness or pain in your knee. Bruising and swelling on your knee, calf, and ankle for 3-4 days. A small amount of fluid coming from the incisions. Follow these instructions at home: Medicines Take over-the-counter and prescription  medicines only as told by your health care provider. Ask your health care provider if the medicine prescribed to you: Requires you to avoid driving or using machinery. Can cause constipation. You may need to take these actions to prevent or treat constipation: Drink enough fluid to keep your urine pale yellow. Take over-the-counter or prescription medicines. Eat foods that are high in fiber, such as beans, whole grains, and fresh fruits and vegetables. Limit foods that are high in fat and processed sugars, such as fried or sweet foods. If you have a brace or immobilizer: Wear it as told by your health care provider. Remove it only as told by your health care provider. Loosen it if your toes tingle, become numb, or turn cold and blue. Keep it clean and dry. Ask your health care provider when it is safe to drive. Bathing Do not take baths, swim, or use a hot tub until your health care provider approves. Keep your bandage (dressing) dry until your health care provider says that it can be removed. If the brace or immobilizer is not waterproof: Do not let it get wet. Cover it with a watertight covering when you take a bath or shower. Incision care  Follow instructions from your health care provider about how to take care of your incisions. Make sure you: Wash your hands with soap and water for at least 20 seconds before and after  you change your dressing. If soap and water are not available, use hand sanitizer. Change your dressing as told by your health care provider. Leave stitches (sutures), skin glue, or adhesive strips in place. These skin closures may need to stay in place for 2 weeks or longer. If adhesive strip edges start to loosen and curl up, you may trim the loose edges. Do not remove adhesive strips completely unless your health care provider tells you to do that. Check your incision areas every day for signs of infection. Check for: Redness. More swelling or pain. Blood or more  fluid. Warmth. Pus or a bad smell.  Managing pain, stiffness, and swelling  If directed, put ice on the affected area. To do this: If you have a removable brace or immobilizer, remove it as told by your health care provider. Put ice in a plastic bag. Place a towel between your skin and the bag. Leave the ice on for 20 minutes, 2-3 times a day. Remove the ice if your skin turns bright red. This is very important. If you cannot feel pain, heat, or cold, you have a greater risk of damage to the area. Move your toes often to reduce stiffness and swelling. Raise (elevate) the injured area above the level of your heart while you are sitting or lying down.  Activity Do not use your knee to support your body weight until your health care provider says that you can. Use crutches or other devices as told by your health care provider. Do physical therapy exercises as told by your health care provider. Physical therapy will help you regain movement and strength in your knee. Follow instructions from your health care provider about: When you may start motion exercises. When you may start riding a stationary bike and doing other low-impact activities. When you may start to jog and do other high-impact activities. Do not lift anything that is heavier than 10 lb (4.5 kg), or the limit that you are told, until your health care provider says that it is safe. Ask your health care provider what activities are safe for you. General instructions Do not use any products that contain nicotine or tobacco, such as cigarettes, e-cigarettes, and chewing tobacco. These can delay healing. If you need help quitting, ask your health care provider. Wear compression stockings as told by your health care provider. These stockings help to prevent blood clots and reduce swelling in your legs. Keep all follow-up visits. This is important. Contact a health care provider if: You have any of these signs of infection: Redness  around an incision. Blood or more fluid coming from an incision. Warmth coming from an incision. Pus or a bad smell coming from an incision. More swelling or pain in your knee. A fever or chills. You have pain that does not get better with medicine. Your incision opens up. Get help right away if: You have trouble breathing. You have chest pain. You have increased pain or swelling in your calf or at the back of your knee. You have numbness and tingling near the knee joint or in the foot, ankle, or toes. You notice that your foot or toes look darker than normal or are cooler than normal. These symptoms may represent a serious problem that is an emergency. Do not wait to see if the symptoms will go away. Get medical help right away. Call your local emergency services (911 in the [place] Do not drive yourself to the hospital. Summary After the procedure, it is  common to have knee pain with bruising and swelling on your knee, calf, and ankle. Icing your knee and raising your leg above the level of your heart will help control the pain and swelling. Do physical therapy exercises as told by your health care provider. Physical therapy will help you regain movement and strength in your knee. This information is not intended to replace advice given to you by your health care provider. Make sure you discuss any questions you have with your healthcare provider. Document Revised: 03/03/2020 Document Reviewed: 03/03/2020 Elsevier Patient Education  2022 [employer] General Anesthesia, Adult, Care After This sheet gives you information about how to care for yourself after your procedure. Your health care provider may also give you more specific instructions. If you have problems or questions, contact your health careprovider. What can I expect after the procedure? After the procedure, the following side effects are common: Pain or discomfort at the IV site. Nausea. Vomiting. Sore throat. Trouble  concentrating. Feeling cold or chills. Feeling weak or tired. Sleepiness and fatigue. Soreness and body aches. These side effects can affect parts of the body that were not involved in surgery. Follow these instructions at home: For the time period you were told by your health care provider:  Rest. Do not participate in activities where you could fall or become injured. Do not drive or use machinery. Do not drink alcohol. Do not take sleeping pills or medicines that cause drowsiness. Do not make important decisions or sign legal documents. Do not take care of children on your own.  Eating and drinking Follow any instructions from your health care provider about eating or drinking restrictions. When you feel hungry, start by eating small amounts of foods that are soft and easy to digest (bland), such as toast. Gradually return to your regular diet. Drink enough fluid to keep your urine pale yellow. If you vomit, rehydrate by drinking water, juice, or clear broth. General instructions If you have sleep apnea, surgery and certain medicines can increase your risk for breathing problems. Follow instructions from your health care provider about wearing your sleep device: Anytime you are sleeping, including during daytime naps. While taking prescription pain medicines, sleeping medicines, or medicines that make you drowsy. Have a responsible adult stay with you for the time you are told. It is important to have someone help care for you until you are awake and alert. Return to your normal activities as told by your health care provider. Ask your health care provider what activities are safe for you. Take over-the-counter and prescription medicines only as told by your health care provider. If you smoke, do not smoke without supervision. Keep all follow-up visits as told by your health care provider. This is important. Contact a health care provider if: You have nausea or vomiting that does  not get better with medicine. You cannot eat or drink without vomiting. You have pain that does not get better with medicine. You are unable to pass urine. You develop a skin rash. You have a fever. You have redness around your IV site that gets worse. Get help right away if: You have difficulty breathing. You have chest pain. You have blood in your urine or stool, or you vomit blood. Summary After the procedure, it is common to have a sore throat or nausea. It is also common to feel tired. Have a responsible adult stay with you for the time you are told. It is important to have someone help care for  you until you are awake and alert. When you feel hungry, start by eating small amounts of foods that are soft and easy to digest (bland), such as toast. Gradually return to your regular diet. Drink enough fluid to keep your urine pale yellow. Return to your normal activities as told by your health care provider. Ask your health care provider what activities are safe for you. This information is not intended to replace advice given to you by your health care provider. Make sure you discuss any questions you have with your healthcare provider. Document Revised: 06/19/2020 Document Reviewed: 01/17/2020 Elsevier Patient Education  2022 [employer] Inc[employer] How to Use Chlorhexidine for Bathing Chlorhexidine gluconate (CHG) is a germ-killing (antiseptic) solution that is used to clean the skin. It can get rid of the bacteria that normally live on the skin and can keep them away for about 24 hours. To clean your skin with CHG, you may be given: A CHG solution to use in the shower or as part of a sponge bath. A prepackaged cloth that contains CHG. Cleaning your skin with CHG may help lower the risk for infection: While you are staying in the intensive care unit of the hospital. If you have a vascular access, such as a central line, to provide short-term or long-term access to your veins. If you have a  catheter to drain urine from your bladder. If you are on a ventilator. A ventilator is a machine that helps you breathe by moving air in and out of your lungs. After surgery. What are the risks? Risks of using CHG include: A skin reaction. Hearing loss, if CHG gets in your ears. Eye injury, if CHG gets in your eyes and is not rinsed out. The CHG product catching fire. Make sure that you avoid smoking and flames after applying CHG to your skin. Do not use CHG: If you have a chlorhexidine allergy or have previously reacted to chlorhexidine. On babies younger than 20 months of age. How to use CHG solution Use CHG only as told by your health care provider, and follow the instructions on the label. Use the full amount of CHG as directed. Usually, this is one bottle. During a shower Follow these steps when using CHG solution during a shower (unless your health care provider gives you different instructions): Start the shower. Use your normal soap and shampoo to wash your face and hair. Turn off the shower or move out of the shower stream. Pour the CHG onto a clean washcloth. Do not use any type of brush or rough-edged sponge. Starting at your neck, lather your body down to your toes. Make sure you follow these instructions: If you will be having surgery, pay special attention to the part of your body where you will be having surgery. Scrub this area for at least 1 minute. Do not use CHG on your head or face. If the solution gets into your ears or eyes, rinse them well with water. Avoid your genital area. Avoid any areas of skin that have broken skin, cuts, or scrapes. Scrub your back and under your arms. Make sure to wash skin folds. Let the lather sit on your skin for 1-2 minutes or as long as told by your health care provider. Thoroughly rinse your entire body in the shower. Make sure that all body creases and crevices are rinsed well. Dry off with a clean towel. Do not put any substances on  your body afterward--such as powder, lotion, or perfume--unless you are  told to do so by your health care provider. Only use lotions that are recommended by the manufacturer. Put on clean clothes or pajamas. If it is the night before your surgery, sleep in clean sheets.  During a sponge bath Follow these steps when using CHG solution during a sponge bath (unless your health care provider gives you different instructions): Use your normal soap and shampoo to wash your face and hair. Pour the CHG onto a clean washcloth. Starting at your neck, lather your body down to your toes. Make sure you follow these instructions: If you will be having surgery, pay special attention to the part of your body where you will be having surgery. Scrub this area for at least 1 minute. Do not use CHG on your head or face. If the solution gets into your ears or eyes, rinse them well with water. Avoid your genital area. Avoid any areas of skin that have broken skin, cuts, or scrapes. Scrub your back and under your arms. Make sure to wash skin folds. Let the lather sit on your skin for 1-2 minutes or as long as told by your health care provider. Using a different clean, wet washcloth, thoroughly rinse your entire body. Make sure that all body creases and crevices are rinsed well. Dry off with a clean towel. Do not put any substances on your body afterward--such as powder, lotion, or perfume--unless you are told to do so by your health care provider. Only use lotions that are recommended by the manufacturer. Put on clean clothes or pajamas. If it is the night before your surgery, sleep in clean sheets. How to use CHG prepackaged cloths Only use CHG cloths as told by your health care provider, and follow the instructions on the label. Use the CHG cloth on clean, dry skin. Do not use the CHG cloth on your head or face unless your health care provider tells you to. When washing with the CHG cloth: Avoid your genital  area. Avoid any areas of skin that have broken skin, cuts, or scrapes. Before surgery Follow these steps when using a CHG cloth to clean before surgery (unless your health care provider gives you different instructions): Using the CHG cloth, vigorously scrub the part of your body where you will be having surgery. Scrub using a back-and-forth motion for 3 minutes. The area on your body should be completely wet with CHG when you are done scrubbing. Do not rinse. Discard the cloth and let the area air-dry. Do not put any substances on the area afterward, such as powder, lotion, or perfume. Put on clean clothes or pajamas. If it is the night before your surgery, sleep in clean sheets.  For general bathing Follow these steps when using CHG cloths for general bathing (unless your health care provider gives you different instructions). Use a separate CHG cloth for each area of your body. Make sure you wash between any folds of skin and between your fingers and toes. Wash your body in the following order, switching to a new cloth after each step: The front of your neck, shoulders, and chest. Both of your arms, under your arms, and your hands. Your stomach and groin area, avoiding the genitals. Your right leg and foot. Your left leg and foot. The back of your neck, your back, and your buttocks. Do not rinse. Discard the cloth and let the area air-dry. Do not put any substances on your body afterward--such as powder, lotion, or perfume--unless you are told to do  so by your health care provider. Only use lotions that are recommended by the manufacturer. Put on clean clothes or pajamas. Contact a health care provider if: Your skin gets irritated after scrubbing. You have questions about using your solution or cloth. Get help right away if: Your eyes become very red or swollen. Your eyes itch badly. Your skin itches badly and is red or swollen. Your hearing changes. You have trouble seeing. You have  swelling or tingling in your mouth or throat. You have trouble breathing. You swallow any chlorhexidine. Summary Chlorhexidine gluconate (CHG) is a germ-killing (antiseptic) solution that is used to clean the skin. Cleaning your skin with CHG may help to lower your risk for infection. You may be given CHG to use for bathing. It may be in a bottle or in a prepackaged cloth to use on your skin. Carefully follow your health care provider's instructions and the instructions on the product label. Do not use CHG if you have a chlorhexidine allergy. Contact your health care provider if your skin gets irritated after scrubbing. This information is not intended to replace advice given to you by your health care provider. Make sure you discuss any questions you have with your healthcare provider. Document Revised: 02/15/2020 Document Reviewed: 03/21/2020 Elsevier Patient Education  2022 [employer].

## 2021-04-27 ENCOUNTER — Other Ambulatory Visit: Payer: Self-pay

## 2021-04-27 ENCOUNTER — Encounter (HOSPITAL_COMMUNITY)
Admission: RE | Admit: 2021-04-27 | Discharge: 2021-04-27 | Disposition: A | Discharge status: Home / Self Care | Payer: 59 | Source: Ambulatory Visit | Attending: Orthopedic Surgery | Admitting: Orthopedic Surgery

## 2021-04-27 DIAGNOSIS — Z01818 Encounter for other preprocedural examination: Secondary | ICD-10-CM | POA: Insufficient documentation

## 2021-04-27 LAB — BASIC METABOLIC PANEL
Anion gap: 10 (ref 5–15)
BUN: 19 mg/dL (ref 6–20)
CO2: 23 mmol/L (ref 22–32)
Calcium: 9.6 mg/dL (ref 8.9–10.3)
Chloride: 104 mmol/L (ref 98–111)
Creatinine, Ser: 0.84 mg/dL (ref 0.61–1.24)
GFR, Estimated: >60 mL/min (ref >60)
Glucose, Bld: 86 mg/dL (ref 70–99)
Potassium: 5.1 mmol/L (ref 3.5–5.1)
Sodium: 137 mmol/L (ref 135–145)

## 2021-04-27 LAB — CBC
HCT: 46.1 % (ref 39.0–52.0)
Hemoglobin: 14.7 g/dL (ref 13.0–17.0)
MCH: 28.6 pg (ref 26.0–34.0)
MCHC: 31.9 g/dL (ref 30.0–36.0)
MCV: 89.7 fL (ref 80.0–100.0)
Platelets: 377 10*3/uL (ref 150–400)
RBC: 5.14 MIL/uL (ref 4.22–5.81)
RDW: 13.5 % (ref 11.5–15.5)
WBC: 9.5 10*3/uL (ref 4.0–10.5)
nRBC: 0 % (ref 0.0–0.2)

## 2021-04-28 ENCOUNTER — Other Ambulatory Visit: Payer: Self-pay

## 2021-04-28 ENCOUNTER — Other Ambulatory Visit: Payer: Self-pay | Admitting: Orthopedic Surgery

## 2021-04-28 DIAGNOSIS — S83241A Other tear of medial meniscus, current injury, right knee, initial encounter: Secondary | ICD-10-CM

## 2021-04-30 ENCOUNTER — Encounter (HOSPITAL_COMMUNITY): Payer: Self-pay | Admitting: Orthopedic Surgery

## 2021-04-30 ENCOUNTER — Ambulatory Visit (HOSPITAL_COMMUNITY): Payer: 59 | Admitting: Certified Registered"

## 2021-04-30 ENCOUNTER — Other Ambulatory Visit: Payer: Self-pay

## 2021-04-30 ENCOUNTER — Encounter (HOSPITAL_COMMUNITY)
Admission: RE | Disposition: A | Discharge status: Home / Self Care | Payer: Self-pay | Source: Home / Self Care | Attending: Orthopedic Surgery

## 2021-04-30 ENCOUNTER — Ambulatory Visit (HOSPITAL_COMMUNITY)
Admission: RE | Admit: 2021-04-30 | Discharge: 2021-04-30 | Disposition: A | Discharge status: Home / Self Care | Payer: 59 | Attending: Orthopedic Surgery | Admitting: Orthopedic Surgery

## 2021-04-30 DIAGNOSIS — Z9889 Other specified postprocedural states: Secondary | ICD-10-CM

## 2021-04-30 DIAGNOSIS — Z6841 Body Mass Index (BMI) 40.0 and over, adult: Secondary | ICD-10-CM | POA: Insufficient documentation

## 2021-04-30 DIAGNOSIS — Z8719 Personal history of other diseases of the digestive system: Secondary | ICD-10-CM | POA: Insufficient documentation

## 2021-04-30 DIAGNOSIS — X58XXXA Exposure to other specified factors, initial encounter: Secondary | ICD-10-CM | POA: Diagnosis not present

## 2021-04-30 DIAGNOSIS — M1711 Unilateral primary osteoarthritis, right knee: Secondary | ICD-10-CM | POA: Diagnosis not present

## 2021-04-30 DIAGNOSIS — S83241A Other tear of medial meniscus, current injury, right knee, initial encounter: Secondary | ICD-10-CM | POA: Insufficient documentation

## 2021-04-30 DIAGNOSIS — E669 Obesity, unspecified: Secondary | ICD-10-CM | POA: Insufficient documentation

## 2021-04-30 DIAGNOSIS — S83241D Other tear of medial meniscus, current injury, right knee, subsequent encounter: Secondary | ICD-10-CM | POA: Diagnosis not present

## 2021-04-30 HISTORY — PX: KNEE ARTHROSCOPY WITH MENISCAL REPAIR: SHX5653

## 2021-04-30 SURGERY — ARTHROSCOPY, KNEE, WITH MENISCUS REPAIR
Anesthesia: General | Site: Knee | Laterality: Right

## 2021-04-30 MED ORDER — CEFAZOLIN SODIUM-DEXTROSE 2-4 GM/100ML-% IV SOLN
2.0000 g | INTRAVENOUS | Status: AC
  Administered 2021-04-30: 2 g via INTRAVENOUS

## 2021-04-30 MED ORDER — CELECOXIB 100 MG PO CAPS: 100.0000 mg | ORAL_CAPSULE | Freq: Every day | ORAL | 0 refills | Status: DC

## 2021-04-30 MED ORDER — BUPIVACAINE-EPINEPHRINE (PF) 0.5% -1:200000 IJ SOLN
INTRAMUSCULAR | Status: AC
  Filled 2021-04-30: qty 30

## 2021-04-30 MED ORDER — LIDOCAINE HCL (PF) 2 % IJ SOLN
INTRAMUSCULAR | Status: AC
  Filled 2021-04-30: qty 5

## 2021-04-30 MED ORDER — LIDOCAINE HCL (CARDIAC) PF 100 MG/5ML IV SOSY
PREFILLED_SYRINGE | INTRAVENOUS | Status: DC | PRN
  Administered 2021-04-30: 100 mg via INTRAVENOUS

## 2021-04-30 MED ORDER — CHLORHEXIDINE GLUCONATE 0.12 % MT SOLN
OROMUCOSAL | Status: AC
  Filled 2021-04-30: qty 15

## 2021-04-30 MED ORDER — EPINEPHRINE PF 1 MG/ML IJ SOLN
INTRAMUSCULAR | Status: AC
  Filled 2021-04-30: qty 4

## 2021-04-30 MED ORDER — CHLORHEXIDINE GLUCONATE 0.12 % MT SOLN: 15.0000 mL | Freq: Once | OROMUCOSAL | Status: AC

## 2021-04-30 MED ORDER — OXYCODONE HCL 5 MG PO TABS: 5.0000 mg | ORAL_TABLET | ORAL | 0 refills | Status: AC | PRN

## 2021-04-30 MED ORDER — ORAL CARE MOUTH RINSE
15.0000 mL | Freq: Once | OROMUCOSAL | Status: AC
  Administered 2021-04-30: 15 mL via OROMUCOSAL

## 2021-04-30 MED ORDER — DEXAMETHASONE SODIUM PHOSPHATE 10 MG/ML IJ SOLN
INTRAMUSCULAR | Status: AC
  Filled 2021-04-30: qty 1

## 2021-04-30 MED ORDER — BUPIVACAINE-EPINEPHRINE (PF) 0.5% -1:200000 IJ SOLN
INTRAMUSCULAR | Status: DC | PRN
  Administered 2021-04-30: 30 mL via PERINEURAL

## 2021-04-30 MED ORDER — PROPOFOL 10 MG/ML IV BOLUS
INTRAVENOUS | Status: AC
  Filled 2021-04-30: qty 40

## 2021-04-30 MED ORDER — METOPROLOL TARTRATE 5 MG/5ML IV SOLN
INTRAVENOUS | Status: DC | PRN
  Administered 2021-04-30: 1 mg via INTRAVENOUS

## 2021-04-30 MED ORDER — ASPIRIN EC 81 MG PO TBEC: 81.0000 mg | DELAYED_RELEASE_TABLET | Freq: Two times a day (BID) | ORAL | 0 refills | Status: AC

## 2021-04-30 MED ORDER — PROPOFOL 10 MG/ML IV BOLUS
INTRAVENOUS | Status: DC | PRN
  Administered 2021-04-30: 200 mg via INTRAVENOUS

## 2021-04-30 MED ORDER — EPINEPHRINE PF 1 MG/ML IJ SOLN
INTRAMUSCULAR | Status: AC
  Filled 2021-04-30: qty 5

## 2021-04-30 MED ORDER — FENTANYL CITRATE (PF) 100 MCG/2ML IJ SOLN
INTRAMUSCULAR | Status: DC | PRN
  Administered 2021-04-30: 25 ug via INTRAVENOUS
  Administered 2021-04-30: 50 ug via INTRAVENOUS
  Administered 2021-04-30 (×4): 25 ug via INTRAVENOUS
  Administered 2021-04-30: 50 ug via INTRAVENOUS
  Administered 2021-04-30: 25 ug via INTRAVENOUS

## 2021-04-30 MED ORDER — MIDAZOLAM HCL 5 MG/5ML IJ SOLN
INTRAMUSCULAR | Status: DC | PRN
  Administered 2021-04-30 (×2): 1 mg via INTRAVENOUS

## 2021-04-30 MED ORDER — FENTANYL CITRATE (PF) 250 MCG/5ML IJ SOLN
INTRAMUSCULAR | Status: AC
  Filled 2021-04-30: qty 5

## 2021-04-30 MED ORDER — FENTANYL CITRATE (PF) 100 MCG/2ML IJ SOLN
25.0000 ug | INTRAMUSCULAR | Status: DC | PRN
  Administered 2021-04-30: 25 ug via INTRAVENOUS
  Administered 2021-04-30: 50 ug via INTRAVENOUS
  Administered 2021-04-30: 25 ug via INTRAVENOUS
  Filled 2021-04-30: qty 2

## 2021-04-30 MED ORDER — ONDANSETRON HCL 4 MG PO TABS: 4.0000 mg | ORAL_TABLET | Freq: Three times a day (TID) | ORAL | 0 refills | Status: AC | PRN

## 2021-04-30 MED ORDER — METOPROLOL TARTRATE 5 MG/5ML IV SOLN
INTRAVENOUS | Status: AC
  Filled 2021-04-30: qty 5

## 2021-04-30 MED ORDER — ONDANSETRON HCL 4 MG/2ML IJ SOLN
INTRAMUSCULAR | Status: AC
  Filled 2021-04-30: qty 2

## 2021-04-30 MED ORDER — LACTATED RINGERS IV SOLN
INTRAVENOUS | Status: DC
  Administered 2021-04-30: 1000 mL via INTRAVENOUS

## 2021-04-30 MED ORDER — MIDAZOLAM HCL 2 MG/2ML IJ SOLN
INTRAMUSCULAR | Status: AC
  Filled 2021-04-30: qty 2

## 2021-04-30 MED ORDER — DEXAMETHASONE SODIUM PHOSPHATE 4 MG/ML IJ SOLN
INTRAMUSCULAR | Status: DC | PRN
  Administered 2021-04-30: 4 mg via INTRAVENOUS

## 2021-04-30 MED ORDER — KETAMINE HCL 50 MG/5ML IJ SOSY
PREFILLED_SYRINGE | INTRAMUSCULAR | Status: AC
  Filled 2021-04-30: qty 5

## 2021-04-30 MED ORDER — ONDANSETRON HCL 4 MG/2ML IJ SOLN: 4.0000 mg | Freq: Once | INTRAMUSCULAR | Status: DC | PRN

## 2021-04-30 MED ORDER — SODIUM CHLORIDE 0.9 % IR SOLN
Status: DC | PRN
  Administered 2021-04-30 (×7): 3000 mL

## 2021-04-30 MED ORDER — ACETAMINOPHEN 500 MG PO TABS: 1000.0000 mg | ORAL_TABLET | Freq: Three times a day (TID) | ORAL | 0 refills | Status: AC

## 2021-04-30 MED ORDER — ONDANSETRON HCL 4 MG/2ML IJ SOLN
INTRAMUSCULAR | Status: DC | PRN
  Administered 2021-04-30: 4 mg via INTRAVENOUS

## 2021-04-30 MED ORDER — KETAMINE HCL 10 MG/ML IJ SOLN
INTRAMUSCULAR | Status: DC | PRN
  Administered 2021-04-30 (×2): 10 mg via INTRAVENOUS

## 2021-04-30 MED ORDER — CEFAZOLIN SODIUM-DEXTROSE 2-4 GM/100ML-% IV SOLN
INTRAVENOUS | Status: AC
  Filled 2021-04-30: qty 100

## 2021-04-30 SURGICAL SUPPLY — 55 items
APL PRP STRL LF DISP 70% ISPRP (MISCELLANEOUS) ×1
BANDAGE ELASTIC 4 VELCRO NS (GAUZE/BANDAGES/DRESSINGS) ×2 IMPLANT
BANDAGE ESMARK 4X12 BL STRL LF (DISPOSABLE) ×1 IMPLANT
BLADE EXCALIBUR 4.0X13 (MISCELLANEOUS) ×2 IMPLANT
BLADE SURG 15 STRL LF DISP TIS (BLADE) ×1 IMPLANT
BLADE SURG 15 STRL SS (BLADE) ×2
BLADE SURG SZ11 CARB STEEL (BLADE) ×2 IMPLANT
BNDG CMPR 12X4 ELC STRL LF (DISPOSABLE) ×1
BNDG CMPR STD VLCR NS LF 5.8X6 (GAUZE/BANDAGES/DRESSINGS) ×1
BNDG ELASTIC 6X5.8 VLCR NS LF (GAUZE/BANDAGES/DRESSINGS) ×2 IMPLANT
BNDG ESMARK 4X12 BLUE STRL LF (DISPOSABLE) ×2
CHLORAPREP W/TINT 26 (MISCELLANEOUS) ×2 IMPLANT
CLOTH BEACON ORANGE TIMEOUT ST (SAFETY) ×2 IMPLANT
COOLER ICEMAN CLASSIC (MISCELLANEOUS) ×2 IMPLANT
CUFF TOURN SGL QUICK 34 (TOURNIQUET CUFF) ×2
CUFF TRNQT CYL 34X4.125X (TOURNIQUET CUFF) ×1 IMPLANT
DECANTER SPIKE VIAL GLASS SM (MISCELLANEOUS) ×2 IMPLANT
DRSG XEROFORM 1X8 (GAUZE/BANDAGES/DRESSINGS) ×2 IMPLANT
GAUZE 4X4 16PLY ~~LOC~~+RFID DBL (SPONGE) ×2 IMPLANT
GAUZE SPONGE 4X4 12PLY STRL (GAUZE/BANDAGES/DRESSINGS) ×2 IMPLANT
GAUZE XEROFORM 1X8 LF (GAUZE/BANDAGES/DRESSINGS) ×2 IMPLANT
GLOVE SKINSENSE NS SZ8.0 LF (GLOVE) ×3
GLOVE SKINSENSE STRL SZ8.0 LF (GLOVE) ×3 IMPLANT
GLOVE SRG 8 PF TXTR STRL LF DI (GLOVE) ×1 IMPLANT
GLOVE SURG UNDER POLY LF SZ7 (GLOVE) ×4 IMPLANT
GLOVE SURG UNDER POLY LF SZ8 (GLOVE) ×2
GOWN STRL REUS W/ TWL XL LVL3 (GOWN DISPOSABLE) ×1 IMPLANT
GOWN STRL REUS W/TWL LRG LVL3 (GOWN DISPOSABLE) ×4 IMPLANT
GOWN STRL REUS W/TWL XL LVL3 (GOWN DISPOSABLE) ×2
IV NS IRRIG 3000ML ARTHROMATIC (IV SOLUTION) ×14 IMPLANT
KIT BLADEGUARD II DBL (SET/KITS/TRAYS/PACK) ×2 IMPLANT
KIT TRANSTIBIAL (DISPOSABLE) ×2 IMPLANT
KIT TURNOVER CYSTO (KITS) ×2 IMPLANT
MANIFOLD NEPTUNE II (INSTRUMENTS) ×4 IMPLANT
MARKER SKIN DUAL TIP RULER LAB (MISCELLANEOUS) ×2 IMPLANT
NEEDLE HYPO 18GX1.5 BLUNT FILL (NEEDLE) ×2 IMPLANT
NEEDLE HYPO 21X1.5 SAFETY (NEEDLE) ×2 IMPLANT
NEEDLE SCORPION MULTI FIRE (NEEDLE) ×2 IMPLANT
NEEDLE SPNL 18GX3.5 QUINCKE PK (NEEDLE) ×2 IMPLANT
PACK ARTHRO LIMB DRAPE STRL (MISCELLANEOUS) ×2 IMPLANT
PAD ABD 5X9 TENDERSORB (GAUZE/BANDAGES/DRESSINGS) ×4 IMPLANT
PAD ARMBOARD 7.5X6 YLW CONV (MISCELLANEOUS) ×2 IMPLANT
PAD COLD SHLDR WRAP-ON (PAD) ×2 IMPLANT
PADDING CAST COTTON 6X4 STRL (CAST SUPPLIES) ×2 IMPLANT
PORT APPOLLO RF 90DEGREE MULTI (SURGICAL WAND) ×2 IMPLANT
SET ARTHROSCOPY INST (INSTRUMENTS) ×2 IMPLANT
SET BASIN LINEN APH (SET/KITS/TRAYS/PACK) ×2 IMPLANT
SUT ETHILON 3 0 FSL (SUTURE) ×2 IMPLANT
SUT MON AB 3-0 SH 27 (SUTURE) ×2 IMPLANT
SYR 10ML LL (SYRINGE) ×2 IMPLANT
SYR 30ML LL (SYRINGE) ×2 IMPLANT
SYS IMPL MENISC ROOT W/SWIVEL (Orthopedic Implant) ×2 IMPLANT
SYSTEM IMPL MENSC ROOT W/SWI (Orthopedic Implant) ×1 IMPLANT
TUBING IN/OUT FLOW W/MAIN PUMP (TUBING) ×2 IMPLANT
YANKAUER SUCT BULB TIP 10FT TU (MISCELLANEOUS) ×4 IMPLANT

## 2021-04-30 NOTE — Interval H&P Note (Signed)
History and Physical Interval Note:  04/30/2021 7:11 AM  Jeremiah Zamora  has presented today for surgery, with the diagnosis of Right medial meniscus posterior root tear.  The various methods of treatment have been discussed with the patient and family. After consideration of risks, benefits and other options for treatment, the patient has consented to  Procedure(s) with comments: KNEE ARTHROSCOPY WITH MENISCAL REPAIR (Right) - Medial meniscus posterior root repair, pt knows to arrive at 6:15 as a surgical intervention.  The patient's history has been reviewed, patient examined, no change in status, stable for surgery.  I have reviewed the patient's chart and labs.  Questions were answered to the patient's satisfaction.     Oliver Barre

## 2021-04-30 NOTE — Anesthesia Procedure Notes (Signed)
Procedure Name: LMA Insertion Date/Time: 04/30/2021 7:36 AM Performed by: Brynda Peon, CRNA Pre-anesthesia Checklist: Patient identified, Emergency Drugs available, Suction available, Patient being monitored and Timeout performed Patient Re-evaluated:Patient Re-evaluated prior to induction Oxygen Delivery Method: Circle system utilized Preoxygenation: Pre-oxygenation with 100% oxygen Induction Type: IV induction LMA: LMA inserted LMA Size: 4.0 Number of attempts: 1 Placement Confirmation: positive ETCO2, CO2 detector and breath sounds checked- equal and bilateral Tube secured with: Tape Dental Injury: Teeth and Oropharynx as per pre-operative assessment

## 2021-04-30 NOTE — Transfer of Care (Signed)
Immediate Anesthesia Transfer of Care Note  Patient: Jeremiah Zamora  Procedure(s) Performed: KNEE ARTHROSCOPY WITH MENISCAL REPAIR (Right: Knee)  Patient Location: PACU  Anesthesia Type:General  Level of Consciousness: awake, alert , oriented and patient cooperative  Airway & Oxygen Therapy: Patient Spontanous Breathing and Patient connected to face mask oxygen  Post-op Assessment: Report given to RN, Post -op Vital signs reviewed and stable and Patient moving all extremities X 4  Post vital signs: Reviewed and stable  Last Vitals:  Vitals Value Taken Time  BP 123/85 04/30/21 1021  Temp    Pulse 77 04/30/21 1022  Resp 23 04/30/21 1022  SpO2 84 % 04/30/21 1022  Vitals shown include unvalidated device data.  Last Pain:  Vitals:   04/30/21 0652  TempSrc: Oral  PainSc: 5       Patients Stated Pain Goal: 8 (04/30/21 9211)  Complications: No notable events documented.

## 2021-04-30 NOTE — Anesthesia Preprocedure Evaluation (Signed)
Anesthesia Evaluation  Patient identified by MRN, date of birth, ID band Patient awake    Reviewed: Allergy & Precautions, H&P , NPO status , Patient's Chart, lab work & pertinent test results, reviewed documented beta blocker date and time   Airway Mallampati: II  TM Distance: >3 FB Neck ROM: full    Dental no notable dental hx.    Pulmonary neg pulmonary ROS,    Pulmonary exam normal breath sounds clear to auscultation       Cardiovascular Exercise Tolerance: Good hypertension, negative cardio ROS   Rhythm:regular Rate:Normal     Neuro/Psych PSYCHIATRIC DISORDERS Anxiety negative neurological ROS     GI/Hepatic Neg liver ROS, GERD  Medicated,  Endo/Other  Hypothyroidism Morbid obesity  Renal/GU negative Renal ROS  negative genitourinary   Musculoskeletal   Abdominal   Peds  Hematology negative hematology ROS (+)   Anesthesia Other Findings   Reproductive/Obstetrics negative OB ROS                             Anesthesia Physical Anesthesia Plan  ASA: 3  Anesthesia Plan: General   Post-op Pain Management:    Induction:   PONV Risk Score and Plan: Ondansetron  Airway Management Planned:   Additional Equipment:   Intra-op Plan:   Post-operative Plan:   Informed Consent: I have reviewed the patients History and Physical, chart, labs and discussed the procedure including the risks, benefits and alternatives for the proposed anesthesia with the patient or authorized representative who has indicated his/her understanding and acceptance.     Dental Advisory Given  Plan Discussed with: CRNA  Anesthesia Plan Comments:         Anesthesia Quick Evaluation

## 2021-04-30 NOTE — Op Note (Signed)
Orthopaedic Surgery Operative Note (CSN: [id number])  Jeremiah Zamora  04-02-1966 Date of Surgery: 04/30/2021   Diagnoses:  Right medial meniscus posterior root tear  Procedure: Right knee arthroscopy with repair of posterior medial mensicus root tear.    Operative Finding Exam under anesthesia: varus alignment.  Negative Lachman.  ROM from 0-120 Suprapatellar pouch:No loose bodies Patellofemoral Compartment:Minor cartilage injury.  Some fissuring of the trochlea.  Distal patella cartilage damage Medial Compartment: Fissuring of the tibial plateau, grade 1/2 changes.  Radial mensicus tear of the posterior horn, immediately adjacent to the posterior root.  5-6 mm of displacement.  Posterior horn was not freely mobile, was not displacing into the joint.  Lateral Compartment: No meniscus tears.  Grade 1/2 changes on tibial plateau Intercondylar Notch: ACL intact.   Successful completion of the planned procedure.     Post-Op Diagnosis: Same Surgeons:Primary: Oliver Barre, MD Location: [facility] OR ROOM 4 Anesthesia: General with local anesthetic at the conclusion of the case.  Antibiotics: Ancef 2 g Tourniquet time:  Total Tourniquet Time Documented: Thigh (Right) - 131 minutes Total: Thigh (Right) - 131 minutes  Estimated Blood Loss: 20 cc Complications: None Specimens: None Implants: Implant Name Type Inv. Item Serial No. Manufacturer Lot No. LRB No. Used Action  Implant System, Meniscal Root Repair with [employer] [id number] Right 1 Implanted    Indications for Surgery:   Jeremiah Zamora is a 55 y.o. male who sustained an injury to the right knee, after twisting event.  He proceeded to have significant pain with occasional buckling sensations.  MRI demonstrated a radial tear with a small amount of displacement of the posterior medial meniscus.  The tear was adjacent to the posterior root.  As a result, I recommended a posterior root, medial meniscus repair in  order to restore form and function of the medial meniscus.  In addition, on MRI, there was some bony edema within the medial femoral condyle.  This will require some time for full recovery.  The patient is aware.  He has remained limited weightbearing with assistance.  Benefits and risks of operative and nonoperative management were discussed prior to surgery with patient and informed consent form was completed.  Specific risks including infection, need for additional surgery, bleeding, progression of arthritis, persistent pain, poor healing of the meniscus and more severe complications associated with anesthesia.  He elected to proceed.   Procedure:   The patient was identified properly. Informed consent was obtained and the surgical site was marked. The patient was taken to the OR where general anesthesia was induced. The patient was placed in the supine position with a post against the surgical leg and a nonsterile tourniquet applied. The surgical leg was then prepped and draped usual sterile fashion.  A standard surgical timeout was performed.  2 standard anterior portals were made and diagnostic arthroscopy performed. Please note the findings as noted above.  There were no injuries to the lateral meniscus.  There was minor cartilage damage within the patellofemoral joint.  He had some fraying of the tibial plateau in both the medial lateral compartments.  [name] was then introduced, and the ligamentum mucosum was taken down improve visualization.  We completed a limited debridement of the fat pad.  Once were satisfied with visualization, a probe was inserted into the medial compartment.  There was a radial tear, immediately adjacent to the posterior root of the medial meniscus.  This was probed and noted to have minimal  displacement overall.  The meniscus could not be displaced into the joint.  Nonetheless, this tear had disrupted the hoop stresses of the medial meniscus, leading to pain and buckling  sensations.  A meniscus scorpion device was introduced into the medial compartment.  We were able to visualize the stump of the posterior aspect of the medial meniscus.  2 separate sutures were passed using the scorpion device, and luggage tag stitches were secured.  At this point, we had complete control of the posterior horn of the medial meniscus.  An aiming device was then introduced through the medial compartment.  A flip cutter was drilled at the approximate attachment site of the posterior horn.  This was visualized under direct arthroscopy.  A flip cutter was then deployed, and a plug of approximately 7-8 mm was completed.  The flip cutter was then introduced back into the joint, and then ultimately removed from the joint.  A wire loop device was then inserted through this drill hole, and removed from the medial compartment.  The 2 luggage tag stitches attached to the posterior horn of the medial meniscus where then passed through the tibial tunnel, and successfully routed through the drill hole on the anterior medial aspect of the tibia.  We pulled the sutures taut, and were satisfied with the reapproximation of the posterior horn of the medial meniscus.  We then drilled a pilot hole for a swivel lock bio composite anchor.  The sutures were then passed through the anchor and this was secured to the anterior medial aspect of the proximal tibia.  Because there is minimal displacement of the posterior horn of the medial meniscus overall, minimal tension was placed on the stitches.  The bio Compass and anchor was then secured into place and achieved an excellent bite.  Final views with the arthroscope demonstrated that the medial meniscus was reapproximated, and there was no further displacement of the medial meniscus.  Portal incisions closed with 3-0 nylon, and the anterior tibial incision was closed with 3-0 Monocryl, followed by 3-0 nylon.  Sterile dressings were applied, followed by an Ace wrap and  Polar Care device.  His right leg was placed in a hinged knee brace in full extension.  The patient was awoken from general anesthesia and taken to the PACU in stable condition without complication.    Post-operative plan:  The patient will be discharged from the PACU once he is stable. The patient will be toe-touch weightbearing on the right lower extremity, with the use of crutches at all times for the first week.  He is to remain in the knee brace in full extension while sleeping for the next 6 weeks. He was provided with a physical therapy protocol, and a referral will be placed for him to initiate therapy within the next 1-2 weeks. DVT prophylaxis Aspirin 81 mg twice daily for 6 weeks.   Pain control with PRN pain medication preferring oral medicines.   Follow up plan will be scheduled in approximately 10-14 days for incision check and XR

## 2021-04-30 NOTE — Discharge Instructions (Addendum)
Raeghan Demeter A. Dallas Schimke, MD MS [facility] [address] [place],  [place]  [address] Phone: [phone] Fax:  [phone]    POST-OPERATIVE INSTRUCTIONS - Knee Arthroscopy  WOUND CARE - You may remove the Operative Dressing on Post-Op Day #3 (72hrs after surgery).  Alternatively if you would like you can leave dressing on until follow-up if within 7-8 days but keep it dry. - Leave steri-strips in place until they fall off on their own, usually 2 weeks postop. - An ACE wrap may be used to control swelling, do not wrap this too tight.  If the initial ACE wrap feels too tight you may loosen it. - There may be a small amount of fluid/bleeding leaking at the surgical site. This is normal; the knee is filled with fluid during the procedure and can leak for 24-48hrs after surgery. You may change/reinforce the bandage as needed.  - Use the Cryocuff or Ice as often as possible for the first 7 days, then as needed for pain relief. Always keep a towel, ACE wrap or other barrier between the cooling unit and your skin.  - You may shower on Post-Op Day #3. Gently pat the area dry. Do not soak the knee in water or submerge it. Do not go swimming in the pool or ocean until 4 weeks after surgery or when otherwise instructed.  Keep dry incisions as dry as possible.   BRACE/AMBULATION - Keep your brace locked in extension at all times for the first week. -  We have sent a referral for Physical therapy to begin as soon as possible -  You can be toe touch weight bearing on the operative extremity, using crutches, for the first 3 weeks.  PT can help with progression after that.     REGIONAL ANESTHESIA (NERVE BLOCKS) - The anesthesia team may have performed a nerve block for you if safe in the setting of your care.  This is a great tool used to minimize pain.  Typically the block may start wearing off overnight.  This can be a challenging period but please utilize your as needed pain  medications to try and manage this period and know it will be a brief transition as the nerve block wears completely   POST-OP MEDICATIONS - Multimodal approach to pain control - In general your pain will be controlled with a combination of substances.  Prescriptions unless otherwise discussed are electronically sent to your pharmacy.  This is a carefully made plan we use to minimize narcotic use.    - Celebrex - Anti-inflammatory medication taken on a scheduled basis - Acetaminophen - Non-narcotic pain medicine taken on a scheduled basis  - Oxycodone - This is a strong narcotic, to be used only on an ?oas needed? basis for pain. - Aspirin 81mg  - This medicine is used to minimize the risk of blood clots after surgery. -  Zofran - take as needed for nausea  FOLLOW-UP   Please call the office to schedule a follow-up appointment for your incision check, 7-10 days post-operatively.   IF YOU HAVE ANY QUESTIONS, PLEASE FEEL FREE TO CALL OUR OFFICE.   HELPFUL INFORMATION  - If you had a block, it will wear off between 8-24 hrs postop typically.  This is period when your pain may go from nearly zero to the pain you would have had post-op without the block.  This is an abrupt transition but nothing dangerous is happening.  You may take an extra dose  of narcotic when this happens.   Keep your leg elevated to decrease swelling, which will then in turn decrease your pain. I would elevate the foot of your bed by putting a couple of couch pillows between your mattress and box spring. I would not keep pillow directly under your ankle.  - Do not sleep with a pillow behind your knee even if it is more comfortable as this may make it harder to get your knee fully straight long term.   There will be MORE swelling on days 1-3 than there is on the day of surgery.  This also is normal. The swelling will decrease with the anti-inflammatory medication, ice and keeping it elevated. The swelling will make it  more difficult to bend your knee. As the swelling goes down your motion will become easier   You may develop swelling and bruising that extends from your knee down to your calf and perhaps even to your foot over the next week. Do not be alarmed. This too is normal, and it is due to gravity   There may be some numbness adjacent to the incision site. This may last for 6-12 months or longer in some patients and is expected.   You may return to sedentary work/school in the next couple of days when you feel up to it. You will need to keep your leg elevated as much as possible    You should wean off your narcotic medicines as soon as you are able.  Most patients will be off or using minimal narcotics before their first postop appointment.    We suggest you use the pain medication the first night prior to going to bed, in order to ease any pain when the anesthesia wears off. You should avoid taking pain medications on an empty stomach as it will make you nauseous.   Do not drink alcoholic beverages or take illicit drugs when taking pain medications.   It is against the law to drive while taking narcotics. You cannot drive if your Right leg is in brace locked in extension.   Pain medication may make you constipated.  Below are a few solutions to try in this order:  o Decrease the amount of pain medication if you aren't having pain.  o Drink lots of decaffeinated fluids.  o Drink prune juice and/or each dried prunes   o If the first 3 don't work start with additional solutions  o Take Colace - an over-the-counter stool softener  o Take Senokot - an over-the-counter laxative  o Take Miralax - a stronger over-the-counter laxative

## 2021-04-30 NOTE — Progress Notes (Signed)
Did not clip knee in pre-op per Dr. Dallas Schimke order.

## 2021-04-30 NOTE — Anesthesia Postprocedure Evaluation (Signed)
Anesthesia Post Note  Patient: Lavonna Monarch  Procedure(s) Performed: KNEE ARTHROSCOPY WITH MENISCAL REPAIR (Right: Knee)  Patient location during evaluation: Phase II Anesthesia Type: General Level of consciousness: awake Pain management: pain level controlled Vital Signs Assessment: post-procedure vital signs reviewed and stable Respiratory status: spontaneous breathing and respiratory function stable Cardiovascular status: blood pressure returned to baseline and stable Postop Assessment: no headache and no apparent nausea or vomiting Anesthetic complications: no Comments: Late entry   No notable events documented.   Last Vitals:  Vitals:   04/30/21 1136 04/30/21 1145  BP:  (!) 162/94  Pulse: 98 (!) 102  Resp: 18 16  Temp:  37 C  SpO2: 94% 97%    Last Pain:  Vitals:   04/30/21 1145  TempSrc: Oral  PainSc: 4                  Windell Norfolk

## 2021-05-01 ENCOUNTER — Encounter (HOSPITAL_COMMUNITY): Payer: Self-pay | Admitting: Orthopedic Surgery

## 2021-05-04 ENCOUNTER — Encounter (HOSPITAL_COMMUNITY): Payer: Self-pay | Admitting: Orthopedic Surgery

## 2021-05-12 ENCOUNTER — Other Ambulatory Visit: Payer: Self-pay | Admitting: Orthopedic Surgery

## 2021-05-12 ENCOUNTER — Ambulatory Visit (INDEPENDENT_AMBULATORY_CARE_PROVIDER_SITE_OTHER): Payer: 59 | Admitting: Orthopedic Surgery

## 2021-05-12 ENCOUNTER — Telehealth: Payer: Self-pay | Admitting: Orthopedic Surgery

## 2021-05-12 ENCOUNTER — Ambulatory Visit: Payer: 59

## 2021-05-12 ENCOUNTER — Other Ambulatory Visit: Payer: Self-pay

## 2021-05-12 ENCOUNTER — Encounter: Payer: Self-pay | Admitting: Orthopedic Surgery

## 2021-05-12 VITALS — Ht 66.0 in | Wt 262.0 lb

## 2021-05-12 DIAGNOSIS — S83241D Other tear of medial meniscus, current injury, right knee, subsequent encounter: Secondary | ICD-10-CM

## 2021-05-12 DIAGNOSIS — Z9889 Other specified postprocedural states: Secondary | ICD-10-CM

## 2021-05-12 NOTE — Patient Instructions (Signed)
Phase I (0-6 wks): Period of protection.   Weight bearing: Touch down weight bearing 3 weeks, progressive increase each week with goal of full WB week 6.   Brace: Hinged knee brace locked in extension at all times for first 1 week. Locked in extension while sleeping for 6 weeks. OK when awake to unlock to 0- 30.   ROM: PROM 0-90, remove brace for exercises and hygiene.   Ice: Not directly on skin. Recommend as much as possible. 5x/day, 20 min/session for first 2 weeks. Then after activity at minimum.   Exercises: Biofeedback and/or E-Stim for muscle re-education and effusion reduction as needed. Heel slides, ankle ROM, Patellar mobilizations, SLR and isometrics for quads, hip abductors/adductors.

## 2021-05-12 NOTE — Progress Notes (Signed)
Orthopaedic Postop Note  Assessment: Jeremiah Zamora is a 55 y.o. male s/p right knee arthroscopy and repair of posterior root, medial meniscus tear  DOS: 04/30/2021  Plan: Sutures were removed, Steri-Strips were placed Intraoperative images reviewed and all questions were answered Brace locked in extension at night x 6 weeks Ok to unlock brace from 0-30 until next visit Remain touch down weight bearing for another week and then gradually increase weightbearing; goal of full weightbearing a 6 weeks. Ok to remove brace for exercises and hygiene. PROM 0-90, straight leg raises, ankle ROM, patellar mobilizations All exercises reviewed.  We will try and ensure that he gets appropriate PT.   Follow-up: Return in about 4 weeks (around 06/09/2021). XR at next visit: None  Subjective:  Chief Complaint  Patient presents with   Routine Post Op    Rt knee DOS 04/30/21    History of Present Illness: Jeremiah Zamora is a 54 y.o. male who presents following the above stated procedure.  He is 2 weeks out from right knee arthroscopy and doing well.  His pain is being controlled.  He has been wearing the brace, locked in extension at all times since surgery.  He has not been able to start physical therapy, as his insurance is not accepted at his local clinic.  He has remained nonweightbearing.  He is using a walker and crutches to assist with ambulation.  No fevers or chills.  Review of Systems: No fevers or chills No numbness or tingling No Chest Pain No shortness of breath   Objective: Ht 5\' 6"  (1.676 m)   Wt 262 lb (118.8 kg)   BMI 42.29 kg/m   Physical Exam:  Alert and oriented.  No acute distress.  Surgical incisions are healing well.  No surrounding erythema or drainage.  He does have some ecchymosis over the distal incision.  The skin edges the needle marks from the pie crusting are healing well.  There is some surrounding erythema, but this is healing.  No tenderness along the  medial or lateral joint lines.  He sits comfortably in full extension of his right knee.  Passively, he can get to 70 degrees of flexion, very comfortably.  Toes are warm and well-perfused.  2+ DP pulse.  IMAGING: I personally ordered and reviewed the following images:  X-rays of the right knee were obtained in clinic today.  There are no acute injuries.  There is an area of the medial femoral condyle which has a similar appearance to an OCD lesion.  The tibial tunnel is easily visualized, and appears to be in a good position.  The suture anchor is in an appropriate position.  Impression: Right knee with medial femoral condyle lucency, and sequelae of medial meniscus root repair.  , MD 05/12/2021 9:42 PM

## 2021-05-12 NOTE — Telephone Encounter (Signed)
Okey Regal asked me about this message, when I got back to Dr Dallas Schimke clinic when I took over at 4. I called patient, since Okey Regal said he was asking about going to ER.  He fell getting out of car, he had small bleeding from one of the incisions at the knee, but no increased pain or other problems  I told him to keep eye on it, keep bandaged, ice it, but does not seem like he needs ER visit currently, unless something changes I instructed him to immediately let us know if he has any trouble such as bleeding that soaks through bandages or drainage or severe pain. He voiced understanding.   To you FYI

## 2021-05-12 NOTE — Telephone Encounter (Signed)
Per call from patien 's wife, state fell when trying to get out of vehicle to get into house after today's appointment - said fell on right side of right knee, and also on right foot; said 1 suture broke open

## 2021-05-14 ENCOUNTER — Other Ambulatory Visit: Payer: Self-pay | Admitting: Orthopedic Surgery

## 2021-05-15 MED ORDER — OXYCODONE HCL 5 MG PO TABS: 5.0000 mg | ORAL_TABLET | Freq: Four times a day (QID) | ORAL | 0 refills | Status: DC | PRN

## 2021-05-19 ENCOUNTER — Ambulatory Visit (HOSPITAL_COMMUNITY): Payer: 59 | Attending: Orthopedic Surgery | Admitting: Physical Therapy

## 2021-05-19 ENCOUNTER — Encounter (HOSPITAL_COMMUNITY): Payer: Self-pay | Admitting: Physical Therapy

## 2021-05-19 ENCOUNTER — Other Ambulatory Visit: Payer: Self-pay

## 2021-05-19 DIAGNOSIS — R262 Difficulty in walking, not elsewhere classified: Secondary | ICD-10-CM | POA: Insufficient documentation

## 2021-05-19 DIAGNOSIS — M25561 Pain in right knee: Secondary | ICD-10-CM | POA: Insufficient documentation

## 2021-05-19 DIAGNOSIS — R2689 Other abnormalities of gait and mobility: Secondary | ICD-10-CM | POA: Diagnosis present

## 2021-05-19 NOTE — Patient Instructions (Signed)
Access Code: [email or web address] URL: [email or web address] Date: 05/19/2021 Prepared by: Georges Lynch  Exercises Supine Quad Set - 3 x daily - 7 x weekly - 1-2 sets - 10 reps - 5 seconds hold Supine Gluteal Sets - 3 x daily - 7 x weekly - 1-2 sets - 10 reps - 5 seconds hold Active Straight Leg Raise with Quad Set - 3 x daily - 7 x weekly - 1-2 sets - 10 reps Supine Heel Slide - 3 x daily - 7 x weekly - 1-2 sets - 10 reps - 5 seconds hold
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
## 2021-05-21 ENCOUNTER — Other Ambulatory Visit: Payer: Self-pay

## 2021-05-21 ENCOUNTER — Encounter (HOSPITAL_COMMUNITY): Payer: Self-pay | Admitting: Physical Therapy

## 2021-05-21 ENCOUNTER — Ambulatory Visit (HOSPITAL_COMMUNITY): Payer: 59 | Admitting: Physical Therapy

## 2021-05-21 DIAGNOSIS — M25561 Pain in right knee: Secondary | ICD-10-CM

## 2021-05-21 DIAGNOSIS — R262 Difficulty in walking, not elsewhere classified: Secondary | ICD-10-CM

## 2021-05-21 DIAGNOSIS — R2689 Other abnormalities of gait and mobility: Secondary | ICD-10-CM
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
## 2021-05-25 ENCOUNTER — Encounter (HOSPITAL_COMMUNITY): Payer: Self-pay | Admitting: Physical Therapy

## 2021-05-25 ENCOUNTER — Ambulatory Visit (HOSPITAL_COMMUNITY): Payer: 59 | Admitting: Physical Therapy

## 2021-05-28 ENCOUNTER — Ambulatory Visit (HOSPITAL_COMMUNITY): Payer: 59 | Admitting: Physical Therapy

## 2021-06-02 ENCOUNTER — Encounter (HOSPITAL_COMMUNITY): Payer: Self-pay

## 2021-06-02 ENCOUNTER — Ambulatory Visit (HOSPITAL_COMMUNITY): Payer: 59

## 2021-06-02 ENCOUNTER — Other Ambulatory Visit: Payer: Self-pay

## 2021-06-02 DIAGNOSIS — M25561 Pain in right knee: Secondary | ICD-10-CM | POA: Diagnosis not present

## 2021-06-02 DIAGNOSIS — R2689 Other abnormalities of gait and mobility: Secondary | ICD-10-CM

## 2021-06-02 DIAGNOSIS — R262 Difficulty in walking, not elsewhere classified: Secondary | ICD-10-CM
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
## 2021-06-04 ENCOUNTER — Encounter (HOSPITAL_COMMUNITY): Payer: Self-pay | Admitting: Physical Therapy

## 2021-06-04 ENCOUNTER — Other Ambulatory Visit: Payer: Self-pay

## 2021-06-04 ENCOUNTER — Ambulatory Visit (HOSPITAL_COMMUNITY): Payer: 59 | Admitting: Physical Therapy

## 2021-06-04 DIAGNOSIS — M25561 Pain in right knee: Secondary | ICD-10-CM

## 2021-06-04 DIAGNOSIS — R2689 Other abnormalities of gait and mobility: Secondary | ICD-10-CM

## 2021-06-04 DIAGNOSIS — R262 Difficulty in walking, not elsewhere classified: Secondary | ICD-10-CM
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
## 2021-06-09 ENCOUNTER — Other Ambulatory Visit: Payer: Self-pay

## 2021-06-09 ENCOUNTER — Encounter (HOSPITAL_COMMUNITY): Payer: Self-pay

## 2021-06-09 ENCOUNTER — Ambulatory Visit (INDEPENDENT_AMBULATORY_CARE_PROVIDER_SITE_OTHER): Payer: 59 | Admitting: Orthopedic Surgery

## 2021-06-09 ENCOUNTER — Ambulatory Visit (HOSPITAL_COMMUNITY): Payer: 59

## 2021-06-09 ENCOUNTER — Encounter: Payer: Self-pay | Admitting: Orthopedic Surgery

## 2021-06-09 DIAGNOSIS — M25561 Pain in right knee: Secondary | ICD-10-CM

## 2021-06-09 DIAGNOSIS — R2689 Other abnormalities of gait and mobility: Secondary | ICD-10-CM

## 2021-06-09 DIAGNOSIS — Z9889 Other specified postprocedural states: Secondary | ICD-10-CM

## 2021-06-09 DIAGNOSIS — R262 Difficulty in walking, not elsewhere classified: Secondary | ICD-10-CM

## 2021-06-09 NOTE — Progress Notes (Signed)
Orthopaedic Postop Note  Assessment: Jeremiah Zamora is a 55 y.o. male s/p right knee arthroscopy and repair of posterior root, medial meniscus tear  DOS: 04/30/2021  Plan: Patient is progressing well.  His pain has improved.  He has excellent range of motion.  He is able to tolerate full weightbearing.  He will continue to progress his activities.  Okay to start coming out of his brace, but I have urged him to do this under the direction of physical therapy.  We will see him back in 6 weeks for repeat evaluation.  Medications as needed.   Follow-up: Return in about 6 weeks (around 07/21/2021). XR at next visit: None  Subjective:  Chief Complaint  Patient presents with   Post-op Follow-up    Right knee meniscus repair 04/30/21    History of Present Illness: Jeremiah Zamora is a 55 y.o. male who presents following the above stated procedure.  Surgery was approximately 6 weeks ago.  He continues to improve.  He is working well with physical therapy.  He states he does not have pain when he bears full weight on the right leg.  However, his therapist recommended he continue to use 1 crutch, particularly when he is ambulating out of the house.  Occasional Tylenol and ibuprofen as needed.  He does note some tingling sensations in the anterolateral thigh, but these improve with the change in his position.    Review of Systems: No fevers or chills No numbness or tingling No Chest Pain No shortness of breath   Objective: There were no vitals taken for this visit.  Physical Exam:  Alert and oriented.  No acute distress.  Surgical incisions are healing well.  He has no tenderness to palpation overlying the incisions.  No surrounding erythema or drainage.  He can easily achieve full extension.  Tolerates flexion beyond 110 degrees.  No tenderness to palpation along the medial joint line.  No tenderness to palpation along the lateral joint line.  He is able to bear full weight, while walking  in his brace.  He does use 1 crutch, but this is providing minimal support.  Sensation is intact throughout the lower leg.  Mild decreased sensation in the anterolateral thigh.  IMAGING: I personally ordered and reviewed the following images:  No new imaging obtained today.  Oliver Barre, MD 06/09/2021 1:25 PM
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
## 2021-06-11 ENCOUNTER — Encounter (HOSPITAL_COMMUNITY): Payer: 59 | Admitting: Physical Therapy

## 2021-06-15 ENCOUNTER — Ambulatory Visit: Payer: 59 | Admitting: Gastroenterology

## 2021-06-16 ENCOUNTER — Ambulatory Visit (HOSPITAL_COMMUNITY): Payer: 59 | Admitting: Physical Therapy

## 2021-06-16 ENCOUNTER — Encounter (HOSPITAL_COMMUNITY): Payer: 59 | Admitting: Physical Therapy

## 2021-06-18 ENCOUNTER — Ambulatory Visit (HOSPITAL_COMMUNITY): Payer: 59 | Attending: Orthopedic Surgery | Admitting: Physical Therapy

## 2021-06-18 ENCOUNTER — Other Ambulatory Visit: Payer: Self-pay

## 2021-06-18 ENCOUNTER — Encounter (HOSPITAL_COMMUNITY): Payer: Self-pay | Admitting: Physical Therapy

## 2021-06-18 DIAGNOSIS — R262 Difficulty in walking, not elsewhere classified: Secondary | ICD-10-CM

## 2021-06-18 DIAGNOSIS — R2689 Other abnormalities of gait and mobility: Secondary | ICD-10-CM | POA: Diagnosis present

## 2021-06-18 DIAGNOSIS — M25561 Pain in right knee: Secondary | ICD-10-CM | POA: Diagnosis present
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
## 2021-06-18 NOTE — Patient Instructions (Signed)
Access Code: [email or web address] URL: [email or web address] Date: 06/18/2021 Prepared by: Georges Lynch  Exercises Heel Raises with Counter Support - 2-3 x daily - 7 x weekly - 2 sets - 10 reps Standing Gastroc Stretch at Counter - 2-3 x daily - 7 x weekly - 1 sets - 3 reps - 20 second hold Standing Hip Abduction with Counter Support - 2-3 x daily - 7 x weekly - 2 sets - 10 reps

## 2021-06-23 ENCOUNTER — Ambulatory Visit (HOSPITAL_COMMUNITY): Payer: 59 | Admitting: Physical Therapy

## 2021-06-23 ENCOUNTER — Encounter (HOSPITAL_COMMUNITY): Payer: Self-pay | Admitting: Physical Therapy

## 2021-06-23 ENCOUNTER — Other Ambulatory Visit: Payer: Self-pay

## 2021-06-23 DIAGNOSIS — R2689 Other abnormalities of gait and mobility: Secondary | ICD-10-CM

## 2021-06-23 DIAGNOSIS — R262 Difficulty in walking, not elsewhere classified: Secondary | ICD-10-CM

## 2021-06-23 DIAGNOSIS — M25561 Pain in right knee: Secondary | ICD-10-CM | POA: Diagnosis not present
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
## 2021-06-23 NOTE — Patient Instructions (Signed)
Access Code: [email or web address] URL: [email or web address] Date: 06/23/2021 Prepared by: Georges Lynch  Exercises Standing Terminal Knee Extension with Resistance - 2-3 x daily - 7 x weekly - 2 sets - 10 reps - 5 second hold

## 2021-06-25 ENCOUNTER — Encounter (HOSPITAL_COMMUNITY): Payer: 59 | Admitting: Physical Therapy

## 2021-06-30 ENCOUNTER — Other Ambulatory Visit: Payer: Self-pay

## 2021-06-30 ENCOUNTER — Encounter (HOSPITAL_COMMUNITY): Payer: Self-pay | Admitting: Physical Therapy

## 2021-06-30 ENCOUNTER — Ambulatory Visit (HOSPITAL_COMMUNITY): Payer: 59 | Admitting: Physical Therapy

## 2021-06-30 DIAGNOSIS — R262 Difficulty in walking, not elsewhere classified: Secondary | ICD-10-CM

## 2021-06-30 DIAGNOSIS — M25561 Pain in right knee: Secondary | ICD-10-CM

## 2021-06-30 DIAGNOSIS — R2689 Other abnormalities of gait and mobility: Secondary | ICD-10-CM
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
## 2021-07-07 ENCOUNTER — Encounter (HOSPITAL_COMMUNITY): Payer: 59 | Admitting: Physical Therapy

## 2021-07-08 ENCOUNTER — Encounter (HOSPITAL_COMMUNITY): Payer: 59 | Admitting: Physical Therapy

## 2021-07-14 ENCOUNTER — Ambulatory Visit (HOSPITAL_COMMUNITY): Payer: 59 | Admitting: Physical Therapy

## 2021-07-16 ENCOUNTER — Encounter (HOSPITAL_COMMUNITY): Payer: 59

## 2021-07-16 ENCOUNTER — Ambulatory Visit (HOSPITAL_COMMUNITY): Payer: 59

## 2021-07-21 ENCOUNTER — Encounter: Payer: 59 | Admitting: Orthopedic Surgery

## 2021-07-21 ENCOUNTER — Ambulatory Visit (HOSPITAL_COMMUNITY): Payer: 59 | Admitting: Physical Therapy

## 2021-07-21 ENCOUNTER — Telehealth: Payer: Self-pay | Admitting: Orthopaedic Surgery

## 2021-07-22 ENCOUNTER — Encounter (HOSPITAL_COMMUNITY): Payer: 59 | Admitting: Physical Therapy

## 2021-07-24 ENCOUNTER — Ambulatory Visit (HOSPITAL_COMMUNITY): Payer: 59 | Admitting: Physical Therapy

## 2021-07-28 ENCOUNTER — Encounter (HOSPITAL_COMMUNITY): Payer: 59 | Admitting: Physical Therapy

## 2021-07-29 ENCOUNTER — Encounter: Payer: 59 | Admitting: Orthopedic Surgery

## 2021-08-05 ENCOUNTER — Other Ambulatory Visit: Payer: Self-pay

## 2021-08-05 ENCOUNTER — Encounter: Payer: Self-pay | Admitting: Orthopedic Surgery

## 2021-08-05 ENCOUNTER — Ambulatory Visit (INDEPENDENT_AMBULATORY_CARE_PROVIDER_SITE_OTHER): Payer: 59 | Admitting: Orthopedic Surgery

## 2021-08-05 VITALS — BP 144/92 | HR 96 | Ht 66.0 in | Wt 262.0 lb

## 2021-08-05 DIAGNOSIS — Z9889 Other specified postprocedural states: Secondary | ICD-10-CM | POA: Diagnosis not present

## 2021-08-05 NOTE — Patient Instructions (Addendum)
Instructions Following Joint Injections  In clinic today, you received an injection in one of your joints (sometimes more than one).  Occasionally, you can have some pain at the injection site, this is normal.  You can place ice at the injection site, or take over-the-counter medications such as Tylenol (acetaminophen) or Advil (ibuprofen).  Please follow all directions listed on the bottle.  If your joint (knee or shoulder) becomes swollen, red or very painful, please contact the clinic for additional assistance.   Two medications were injected, including lidocaine and a steroid (often referred to as cortisone).  Lidocaine is effective almost immediately but wears off quickly.  However, the steroid can take a few days to improve your symptoms.  In some cases, it can make your pain worse for a couple of days.  Do not be concerned if this happens as it is common.  You can apply ice or take some over-the-counter medications as needed.    Continue to work on strengthening of your right leg.  Medications as needed

## 2021-08-05 NOTE — Progress Notes (Signed)
Orthopaedic Postop Note  Assessment: Jeremiah Zamora is a 55 y.o. male s/p right knee arthroscopy and repair of posterior root, medial meniscus tear  DOS: 04/30/2021  Plan: Patient has regained his range of motion, but continues to have pain with weightbearing.  He has no pain at rest.  He does have a mild varus alignment overall, and arthritis was noted during the arthroscopic procedure.  He continues to have atrophy of the right quadriceps and I have encouraged him to work on his strengthening.  This may lead to gradual improvements in his pain.  I offered him a steroid injection, and he elected to proceed.  Follow-up in approximately 3 months  Procedure note injection Right knee joint   Verbal consent was obtained to inject the right knee joint  Timeout was completed to confirm the site of injection.  The skin was prepped with alcohol and ethyl chloride was sprayed at the injection site.  A 21-gauge needle was used to inject 40 mg of Depo-Medrol and 1% lidocaine (3 cc) into the right knee using an anterolateral approach.  There were no complications. A sterile bandage was applied.    Follow-up: Return in about 3 months (around 11/05/2021). XR at next visit: None  Subjective:  Chief Complaint  Patient presents with   Knee Pain    Right S/P medial meniscus repair DOS 04/30/21    History of Present Illness: Jeremiah Zamora is a 55 y.o. male who presents following the above stated procedure.  Surgery was approximately 3 months ago.  He continues to have pain in the right knee, especially with weightbearing.  At rest, he denies pain.  He is still limping due to the pain.  He is taking over-the-counter pain medications as needed.  He continues to work on range of motion, and some strengthening.  He is not using an assistive device when he ambulates.  Review of Systems: No fevers or chills No numbness or tingling No Chest Pain No shortness of breath   Objective: BP (!) 144/92    Pulse 96   Ht 5\' 6"  (1.676 m)   Wt 262 lb (118.8 kg)   BMI 42.29 kg/m   Physical Exam:  Alert and oriented.  No acute distress.  Evaluation of the right knee demonstrates no swelling.  Surgical incisions are healing well, without surrounding erythema or drainage.  He is able to get to full extension.  Flexion to greater than 120 degrees.  No tenderness to palpation along the medial joint line.  No tenderness to palpation along the lateral joint line.  Mild varus alignment overall.  No increased laxity to varus or valgus stress.  Negative Lachman.  IMAGING: I personally ordered and reviewed the following images:  No new imaging obtained today.  , MD 08/05/2021 10:41 PM

## 2021-10-14 ENCOUNTER — Ambulatory Visit: Payer: 59 | Admitting: Orthopedic Surgery

## 2021-10-20 ENCOUNTER — Ambulatory Visit: Payer: 59

## 2021-10-20 ENCOUNTER — Ambulatory Visit: Payer: 59 | Admitting: Orthopedic Surgery

## 2021-10-20 ENCOUNTER — Other Ambulatory Visit: Payer: Self-pay

## 2021-10-20 ENCOUNTER — Encounter: Payer: Self-pay | Admitting: Orthopedic Surgery

## 2021-10-20 VITALS — BP 149/98 | HR 82 | Ht 66.0 in | Wt 260.0 lb

## 2021-10-20 DIAGNOSIS — M25561 Pain in right knee: Secondary | ICD-10-CM | POA: Diagnosis not present

## 2021-10-20 DIAGNOSIS — G8929 Other chronic pain: Secondary | ICD-10-CM

## 2021-10-20 NOTE — Progress Notes (Addendum)
Orthopaedic Postop Note  Assessment: Jeremiah Zamora is a 56 y.o. male s/p right knee arthroscopy and repair of posterior root, medial meniscus tear  DOS: 04/30/2021  Plan: We reviewed radiographs in clinic today which demonstrates some interval consolidation of the medial femoral condyle.  However, the contour of the condyle is a little bit flat.  This could be contributing to his discomfort.  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  As such, we will continue to monitor for progression, and repeat imaging may be warranted.   As has been discussed previously, he does have a varus alignment, and is overweight.  This could be putting additional stress through the medial compartment.  We discussed multiple treatment options.  He is aware that he can take naproxen up to twice daily.  I have encouraged him to start using the brace on his right knee once again.  I have also provided him with some home exercises to work on strengthening of his right leg.  He stated his understanding.  If he continues to have difficulty, we will likely proceed with an MRI for further evaluation of the right knee.   Follow-up: Return in about 3 months (around 01/18/2022). XR at next visit: None  Subjective:  Chief Complaint  Patient presents with   Knee Pain    Rt knee pain getting worse over past month. Pins and needles still since surgery    History of Present Illness: Jeremiah Zamora is a 56 y.o. male who presents following the above stated procedure.  Surgery was approximately 6 months ago.  He has pain in the right knee, specifically within the medial compartment, as well as the posterior knee.  He has no pain at rest.  He notes that he needs a minute or 2 upon standing, before he can start to walk due to some pain.  He is out of work at this time due to pain in his right knee.  He takes naproxen once every couple of  days.  He is not using a brace currently.  He has not worked with physical therapy in a while.  He states the injection from the last visit lasted approximately 1 month, and then the pain started to worsen.   Review of Systems: No fevers or chills No numbness or tingling No Chest Pain No shortness of breath   Objective: BP (!) 149/98    Pulse 82    Ht 5\' 6"  (1.676 m)    Wt 260 lb (117.9 kg)    BMI 41.97 kg/m   Physical Exam:  Alert and oriented.  No acute distress.  Right knee surgical incisions are healing well.  No surrounding erythema or drainage.  He has fullness and tenderness to palpation within the popliteal fossa.  He is able to achieve full extension.  He tolerates flexion greater than 120 degrees.  No increased laxity varus or valgus stress.  Mild varus alignment overall.  Negative Lachman.  Tenderness to palpation along the medial joint line.  Tenderness to palpation along the medial femoral condyle.  Toes are warm and well-perfused.  Sensations intact over the dorsum of his foot.  IMAGING: I personally ordered and reviewed the following images:  X-rays of the right knee were obtained in clinic today.  There is a mild varus alignment overall.  Sequelae of previous surgery is visible.  Trajectory of the drill is appropriate.  There is decreased lucency  of the medial femoral condyle compared to previous x-rays.  The contour of the medial femoral condyle is blunted compared to his baseline.  Mild loss of joint space within the medial compartment.  Impression: Right knee x-ray with mild degenerative changes, and mild varus alignment overall.  Oliver Barre, MD 10/20/2021 10:07 AM

## 2021-10-20 NOTE — Patient Instructions (Signed)

## 2021-10-28 ENCOUNTER — Encounter (HOSPITAL_COMMUNITY): Payer: Self-pay | Admitting: Physical Therapy

## 2021-10-28 NOTE — Therapy (Signed)
[place], [place], [address] Phone: [phone]   Fax:  [phone]  Patient Details  Name: GAUDENCIO CHESNUT MRN: [id number] Date of Birth: [birth year] Referring Provider:  No ref. provider found  Encounter Date: 10/28/2021  PHYSICAL THERAPY DISCHARGE SUMMARY  Visits from Start of Care: 8  Current functional level related to goals / functional outcomes: NA   Remaining deficits: NA   Education / Equipment: Patient DC per non return    Patient agrees to discharge. Patient goals were partially met. Patient is being discharged due to not returning since the last visit.  11:52 AM, 10/28/21 Josue Hector PT DPT  Physical Therapist with [facility]  [phone] [place] [address] [place], [place], [address] Phone: [phone]   Fax:  [phone]

## 2021-11-06 ENCOUNTER — Ambulatory Visit: Payer: 59 | Admitting: Orthopedic Surgery

## 2022-01-19 ENCOUNTER — Ambulatory Visit: Payer: 59

## 2022-01-19 ENCOUNTER — Ambulatory Visit: Payer: 59 | Admitting: Orthopedic Surgery

## 2022-01-19 ENCOUNTER — Encounter: Payer: Self-pay | Admitting: Orthopedic Surgery

## 2022-01-19 VITALS — Ht 66.0 in | Wt 260.0 lb

## 2022-01-19 DIAGNOSIS — G8929 Other chronic pain: Secondary | ICD-10-CM | POA: Diagnosis not present

## 2022-01-19 DIAGNOSIS — Z9889 Other specified postprocedural states: Secondary | ICD-10-CM | POA: Diagnosis not present

## 2022-01-19 DIAGNOSIS — M25561 Pain in right knee: Secondary | ICD-10-CM | POA: Diagnosis not present

## 2022-01-19 MED ORDER — CELECOXIB 100 MG PO CAPS: 100.0000 mg | ORAL_CAPSULE | Freq: Two times a day (BID) | ORAL | 0 refills | Status: DC

## 2022-01-19 NOTE — Patient Instructions (Signed)
While we are working on your approval for MRI please go ahead and call to schedule your appointment with [facility] within at least 2 weeks ? ?Central Scheduling ?[phone]  ?

## 2022-01-20 ENCOUNTER — Encounter: Payer: Self-pay | Admitting: Orthopedic Surgery

## 2022-01-20 NOTE — Progress Notes (Signed)
Orthopaedic Postop Note ? ?Assessment: ?Jeremiah Zamora is a 56 y.o. male s/p right knee arthroscopy and repair of posterior root, medial meniscus tear ? ?DOS: 04/30/2021 ? ?Plan: ?[name] continues to have pain in his right knee.  He has full range of motion.  Pain is localized to the medial aspect of the knee.  We reviewed radiographs in clinic today, and continue to highlight the flattening of the medial femoral condyle, with a lucent area, which could represent some spontaneous osteonecrosis.  He has a varus alignment overall, and the medial compartment is being stressed.  This could be contributing to his symptoms.  We have fitted him for a medial off loader brace, and we will proceed with a follow-up MRI.  He was given a prescription for Celebrex.  Once the MRI results are available, we will meet in the office to discuss the results.  All questions were answered, and he is amenable to this plan. ? ?Follow-up: ?Return for After MRI. ?XR at next visit: None ? ?Subjective: ? ?Chief Complaint  ?Patient presents with  ? Routine Post Op  ?  Rt knee DOS 04/30/21  ? ? ?History of Present Illness: ?Jeremiah Zamora is a 56 y.o. male who presents following the above stated procedure.  Surgery was approximately 9 months ago.  He continues to have pain, primarily in the medial aspect of the right knee.  He has difficulty ambulating as result.  He has been taking anti-inflammatories as needed.  Prior injection only provided relief for about a month.  No specific injury since he was last seen in clinic.  He has not tried a brace yet.  He states he is now moved to [facility], as his mother recently passed away. ? ?Review of Systems: ?No fevers or chills ?No numbness or tingling ?No Chest Pain ?No shortness of breath ? ? ?Objective: ?Ht 5\' 6"  (1.676 m)   Wt 260 lb (117.9 kg)   BMI 41.97 kg/m?  ? ?Physical Exam: ? ?Alert and oriented.  No acute distress. ? ?Right knee surgical incisions have healed well.  No  surrounding erythema or drainage.  He is able to achieve full extension.  Tolerates flexion beyond 120 degrees.  Varus alignment overall.  He walks with a right-sided antalgic gait.  Tenderness to palpation over the medial joint line.  Mild tenderness to palpation of the medial femoral condyle.  No crepitus is appreciated.  Toes are warm and well-perfused. ? ?IMAGING: ?I personally ordered and reviewed the following images: ? ?X-rays of the right knee demonstrates a varus alignment overall.  Sequelae of prior surgery remain unchanged.  No acute injuries are noted.  The medial femoral condyle is flattened.  There is a small area rounded lucency at the articular margin of the medial femoral condyle, with surrounding subchondral sclerotic bone. ? 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 ? ? , MD ?01/20/2022 ?8:33 AM ? ? ?

## 2022-02-04 ENCOUNTER — Ambulatory Visit (HOSPITAL_COMMUNITY)
Admission: RE | Admit: 2022-02-04 | Discharge: 2022-02-04 | Disposition: A | Discharge status: Home / Self Care | Payer: 59 | Source: Ambulatory Visit | Attending: Orthopedic Surgery | Admitting: Orthopedic Surgery

## 2022-02-04 DIAGNOSIS — G8929 Other chronic pain: Secondary | ICD-10-CM | POA: Insufficient documentation

## 2022-02-04 DIAGNOSIS — Z9889 Other specified postprocedural states: Secondary | ICD-10-CM | POA: Insufficient documentation

## 2022-02-04 DIAGNOSIS — M25561 Pain in right knee: Secondary | ICD-10-CM | POA: Insufficient documentation

## 2022-02-04 IMAGING — MR MR KNEE*R* W/O CM
8 series · 40 of 40 positions shown · non-contrast
Comparison: Right knee radiographs [DATE], MRI right knee
[DATE]

CLINICAL DATA: Continued right knee pain status post right knee
arthroscopy and repair of posterior root medial meniscal tear.
Evaluate for meniscal injury.

EXAM:
MRI OF THE RIGHT KNEE WITHOUT CONTRAST
TECHNIQUE: Multiplanar, multisequence MR imaging of the knee was performed. No
intravenous contrast was administered.

[Series 8: T2 fat-sat · axial · right · 4.0mm · 0.47mm/px · z∈[-73,+51]mm · 6 of 26 slices shown (1 of 4)]
[im 1/26]
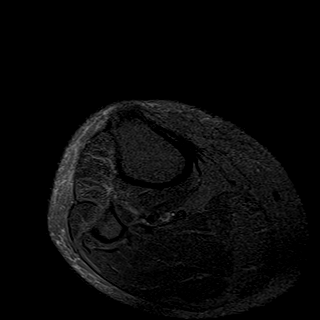
[im 6/26]
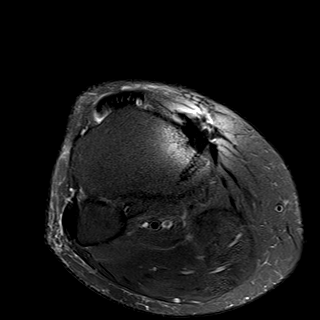
[im 11/26]
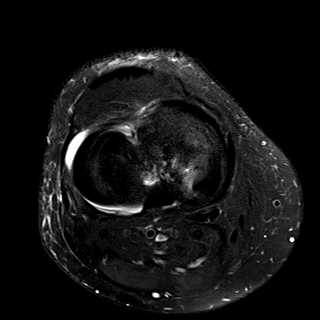
[im 16/26]
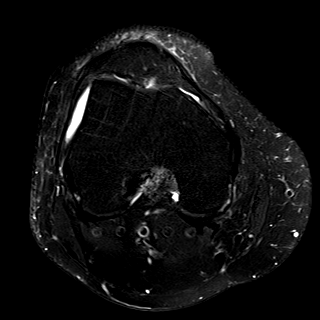
[im 21/26]
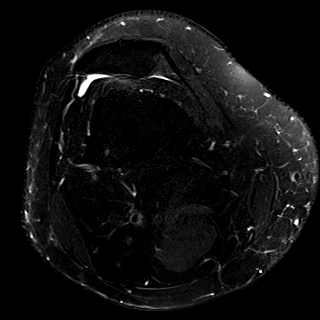
[im 26/26]
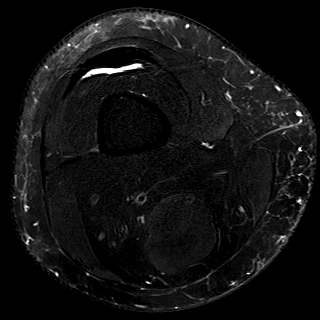

[Series 9: T1 · coronal · right · 4.0mm · 0.59mm/px · 5 of 24 slices shown]
[im 1/24]
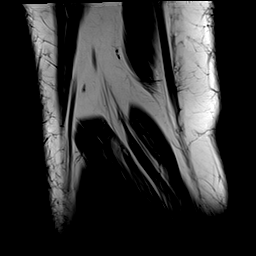
[im 6/24]
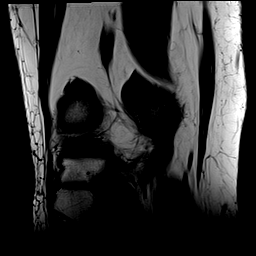
[im 12/24]
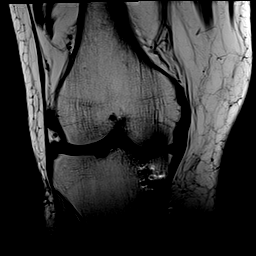
[im 18/24]
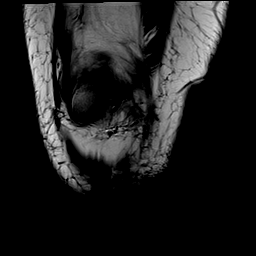
[im 24/24]
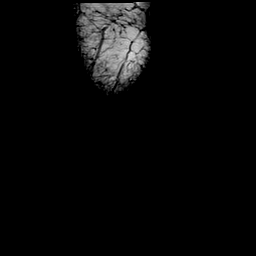

[Series 10: T2 fat-sat · coronal · right · 4.0mm · 0.59mm/px · 5 of 28 slices shown (2 of 4)]
[im 1/28]
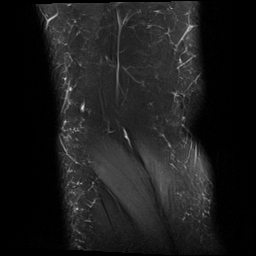
[im 7/28]
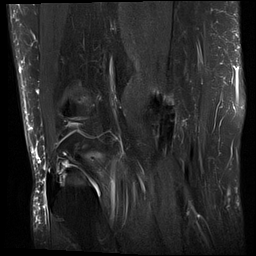
[im 14/28]
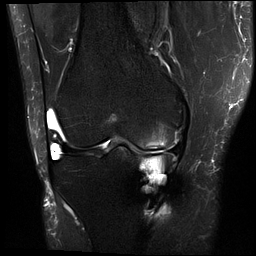
[im 21/28]
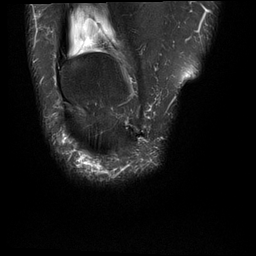
[im 28/28]
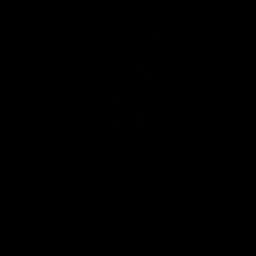

[Series 11: PD fat-sat · coronal · right · 4.0mm · 0.59mm/px · 5 of 27 slices shown (1 of 2)]
[im 1/27]
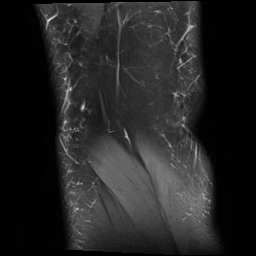
[im 7/27]
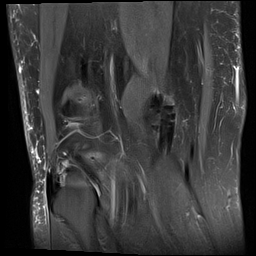
[im 14/27]
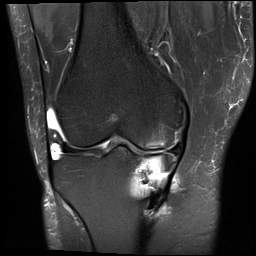
[im 20/27]
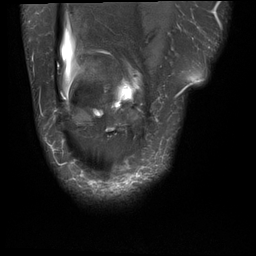
[im 27/27]
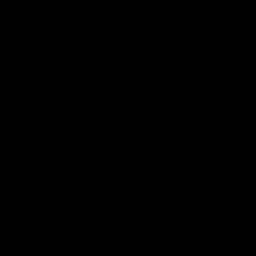

[Series 12: PD fat-sat · sagittal · right · 3.0mm · 0.52mm/px · 6 of 32 slices shown (2 of 2)]
[im 1/32]
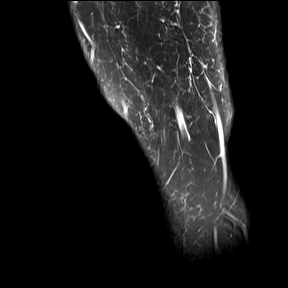
[im 7/32]
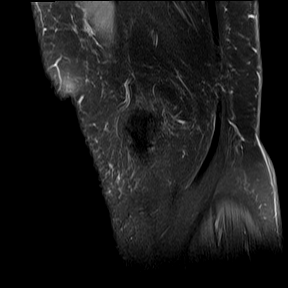
[im 13/32]
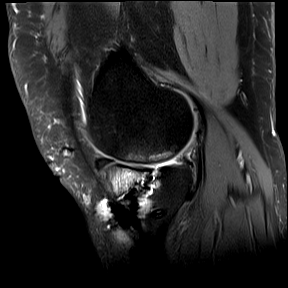
[im 19/32]
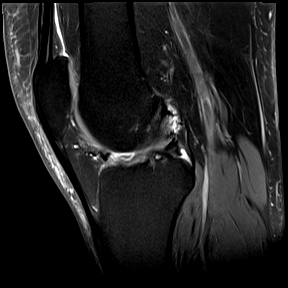
[im 25/32]
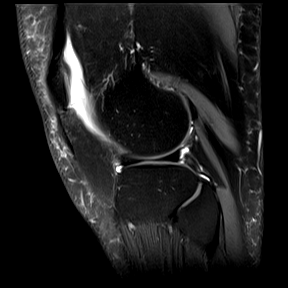
[im 32/32]
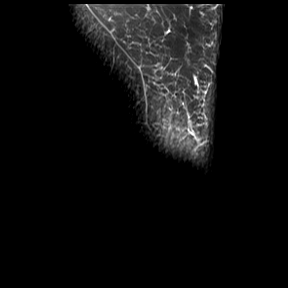

[Series 13: T2 fat-sat · sagittal · right · 3.0mm · 0.59mm/px · 5 of 27 slices shown (3 of 4)]
[im 1/27]
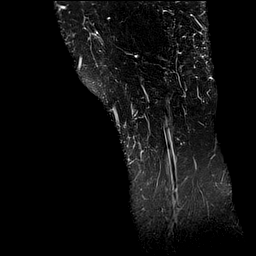
[im 7/27]
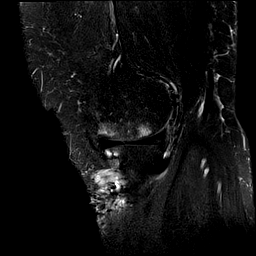
[im 14/27]
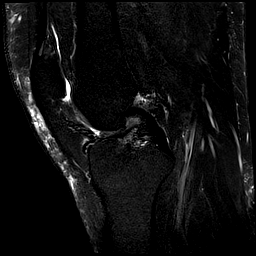
[im 20/27]
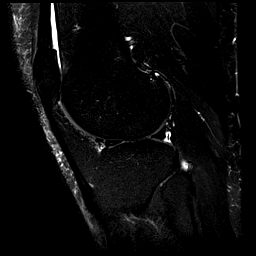
[im 27/27]
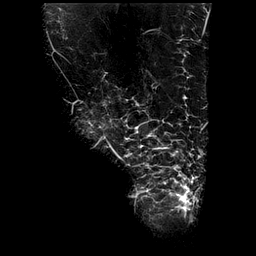

[Series 14: T2 fat-sat · axial · right · 4.0mm · 0.47mm/px · z∈[-78,+56]mm · 5 of 28 slices shown (4 of 4)]
[im 1/28]
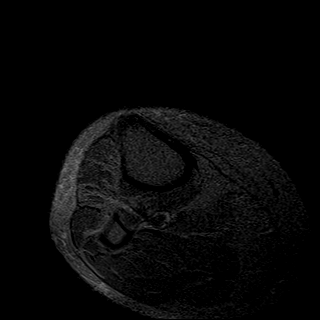
[im 7/28]
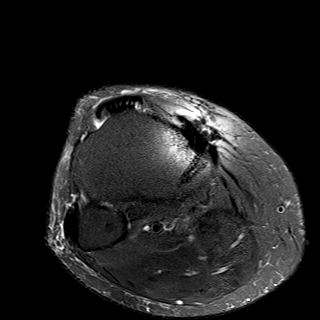
[im 14/28]
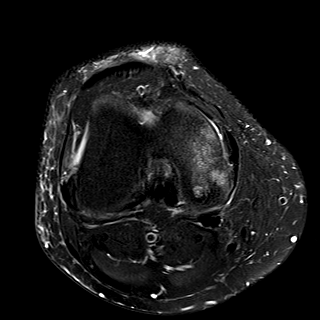
[im 21/28]
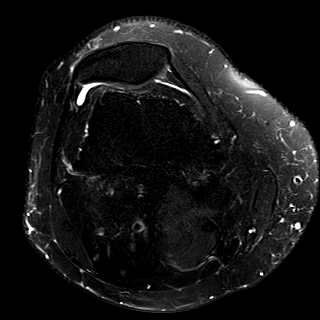
[im 28/28]
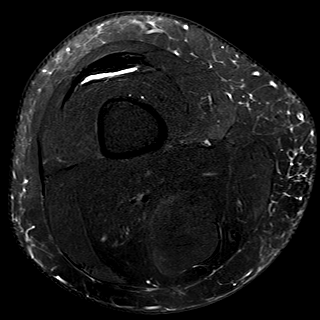

[Series 15: PD · coronal · right · 2.0mm · 0.47mm/px · 3 of 14 slices shown]
[im 1/14]
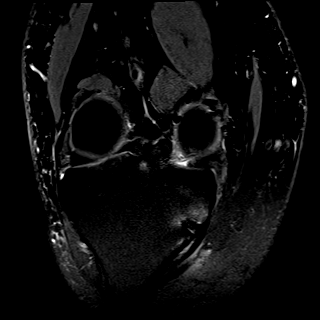
[im 7/14]
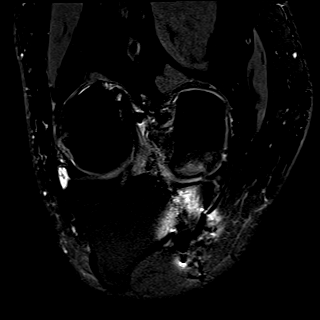
[im 14/14]
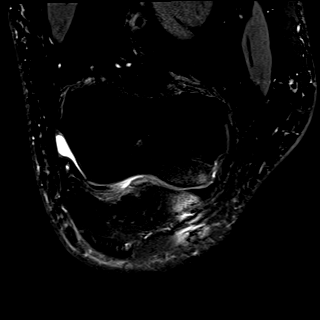

[40 of 40 positions shown; findings below may reference images not displayed]

FINDINGS: MENISCI

Medial meniscus: There is new metallic susceptibility artifact
throughout the proximal medial tibial plateau extending towards the
root of the posterior horn of the medial meniscus related to
interval repair of the previously seen posterior root medial
meniscal tear. There is persistent absence of meniscal tissue within
the 6 mm of the posterior horn closer to the root. There is
scattered metallic artifact within this region of the posteromedial
compartment. There is an undersurface defect of the more medial
aspect of the posterior horn of the medial meniscus extending
through the inferior articular surface of the middle and central
thirds of the meniscal triangle (sagittal images 20 and 21). There
is intermediate proton density signal intrasubstance degeneration
within the peripheral inferior aspect of the posterior segment of
the body of the medial meniscus (coronal image 11).

Lateral meniscus:  Intact.

LIGAMENTS

Cruciates: There is mild intermediate T2 signal within the ACL
fibers, likely sprain/mucoid degeneration. No definite tear. The PCL
is intact.

Collaterals: The medial collateral ligament is intact. The fibular
collateral ligament, biceps femoris tendon, iliotibial band, and
popliteus tendon are intact.

CARTILAGE

Patellofemoral: Full-thickness fissure within the inferior patellar
apex cartilage unchanged (axial image 10). Mild thinning of the
superior trochlear notch cartilage.

Medial: There is horizontal linear decreased T1 and increased T2
signal within the weight-bearing medial femoral condyle with
moderate surrounding marrow edema, in a similar distribution to
prior and suggesting a recurrent subchondral insufficiency fracture
in a region measuring up to 2.5 cm in AP dimension and 1.0 cm in
transverse dimension.

Lateral: Mild surface irregularity of the lateral tibial plateau
cartilage.

Joint: Smalljoint effusion. Normal Hoffa's fat pad. No plical
thickening.

Popliteal Fossa:  No Baker's cyst.

Extensor Mechanism:  Intact quadriceps tendon and patellar tendon.

Bones:  No acute fracture or dislocation.

Other: None.
IMPRESSION: Compared to [DATE]:

1. Interval repair of the prior root of the posterior horn of the
medial meniscus. There is persistent absence of meniscal tissue at
the root of the posterior horn with peripheral displacement of the
posterior horn approximately 6 mm, similar to prior.
2. There is again an acute to subacute subchondral insufficiency
fracture within the weight-bearing medial femoral condyle, similar
to [DATE] prior MRI. This may be a recurrent onset of a similar
insufficiency fracture seen 10 months earlier. No overlying cortical
collapse.
3. Small joint effusion.

## 2022-02-12 ENCOUNTER — Ambulatory Visit: Payer: 59 | Admitting: Orthopedic Surgery

## 2022-02-15 ENCOUNTER — Other Ambulatory Visit: Payer: Self-pay | Admitting: Orthopedic Surgery

## 2022-02-16 ENCOUNTER — Encounter: Payer: Self-pay | Admitting: Orthopedic Surgery

## 2022-02-16 ENCOUNTER — Ambulatory Visit: Payer: 59 | Admitting: Orthopedic Surgery

## 2022-02-16 VITALS — Ht 66.0 in | Wt 260.0 lb

## 2022-02-16 DIAGNOSIS — Z9889 Other specified postprocedural states: Secondary | ICD-10-CM | POA: Diagnosis not present

## 2022-02-16 DIAGNOSIS — G8929 Other chronic pain: Secondary | ICD-10-CM

## 2022-02-16 DIAGNOSIS — M25561 Pain in right knee: Secondary | ICD-10-CM | POA: Diagnosis not present

## 2022-02-16 NOTE — Patient Instructions (Signed)

## 2022-02-17 ENCOUNTER — Encounter: Payer: Self-pay | Admitting: Orthopedic Surgery

## 2022-02-17 NOTE — Progress Notes (Signed)
Orthopaedic Postop Note ? ?Assessment: ?Jeremiah Zamora is a 56 y.o. male s/p right knee arthroscopy and repair of posterior root, medial meniscus tear ? ?DOS: 04/30/2021 ? ?Plan: ?Jeremiah Zamora notes improvement in his symptoms.  He now localizes the pain to the anterior aspect of his knee, around the patella, as well as the posterior knee.  He has been wearing a brace, and notes improvement.  He also thinks the Celebrex is helping with his pain.  We reviewed the results of the MRI in clinic.  We also briefly discussed the images from surgery.  He does have some degenerative changes within the patellofemoral joint, which can explain the pain he is having in the anterior knee.  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  I have advised him to continue using the brace, as long as he finds it helpful.  In addition, he should remain active, and try and improve the strength in his right quadriceps.  We also discussed proceeding with an injection, which she has received previously, with some improvement in his symptoms.  This was completed in clinic today.  All questions were answered and he is amenable to this plan.  He will contact the clinic if he has any further issues. ? ?Procedure note injection Right knee joint ?  ?Verbal consent was obtained to inject the right knee joint  ?Timeout was completed to confirm the site of injection.  The skin was prepped with alcohol and ethyl chloride was sprayed at the injection site.  ?A 21-gauge needle was used to inject 40 mg of Depo-Medrol and 1% lidocaine (3 cc) into the right knee using an anterolateral approach.  ?There were no complications. A sterile bandage was applied. ? ?Follow-up: ?Return if symptoms worsen or fail to improve. ?XR at next visit: None ? ?Subjective: ? ?Chief Complaint  ?Patient presents with  ? Knee Pain  ?  Rt knee MRI results  ? ? ?History of Present Illness: ?Jeremiah Zamora is a 56 y.o. male who presents following the above stated procedure.  He was last seen in clinic approximately 1 month ago.  He has obtained an MRI of his left knee.  He has been wearing a brace for the past month, and states that his knee feels a little bit better.  The Celebrex is also helpful.  He states he has pain in the anterior aspect of the knee, as well as his posterior knee. ? ?Review of Systems: ?No fevers or chills ?No numbness or tingling ?No Chest Pain ?No shortness of breath ? ? ?Objective: ?Ht 5\' 6"  (1.676 m)   Wt 260 lb (117.9 kg)   BMI 41.97 kg/m?  ? ?Physical Exam: ? ?Alert and oriented.  No acute distress. ? ? ?Right knee surgical incisions are healing well.  No surrounding erythema or drainage.  No effusion is appreciated.  Small cyst is appreciated in the posterior aspect of the knee.  No tenderness to palpation along the medial joint line, or over the medial femoral condyle.  Some crepitus is appreciated with range of motion testing.  Negative Lachman.  No increased laxity varus or valgus stress.  Mild clinical varus alignment. ? ? ?IMAGING: ?I personally ordered and reviewed the following images: ? ?Right knee MRI ? ?IMPRESSION: ?Compared to 03/26/2021: ?  ?1. Interval repair of the prior root of the posterior horn of the ?medial meniscus. There is persistent absence  of meniscal tissue at ?the root of the posterior horn with peripheral displacement of the ?posterior horn approximately 6 mm, similar to prior. ?2. There is again an acute to subacute subchondral insufficiency ?fracture within the weight-bearing medial femoral condyle, similar ?to 03/26/2021 prior MRI. This may be a recurrent onset of a similar ?insufficiency fracture seen 10 months earlier. No overlying cortical ?collapse. ?3. Small joint effusion. ?4. Patellofemoral compartment - Full-thickness fissure within the inferior patellar ?apex cartilage unchanged (axial image 10). Mild thinning of the ?superior trochlear notch  cartilage. ?  ? ?Oliver Barre, MD ?02/17/2022 ?10:51 AM ? ? ?

## 2022-02-22 ENCOUNTER — Encounter: Payer: Self-pay | Admitting: Orthopedic Surgery

## 2022-03-15 ENCOUNTER — Other Ambulatory Visit: Payer: Self-pay | Admitting: Orthopedic Surgery

## 2022-03-23 ENCOUNTER — Encounter: Payer: Self-pay | Admitting: Orthopedic Surgery

## 2022-04-11 ENCOUNTER — Other Ambulatory Visit: Payer: Self-pay | Admitting: Orthopedic Surgery

## 2022-05-11 ENCOUNTER — Other Ambulatory Visit: Payer: Self-pay | Admitting: Orthopedic Surgery

## 2022-06-10 ENCOUNTER — Other Ambulatory Visit: Payer: Self-pay | Admitting: Orthopedic Surgery

## 2022-07-07 ENCOUNTER — Other Ambulatory Visit: Payer: Self-pay | Admitting: Orthopedic Surgery

## 2022-08-13 ENCOUNTER — Other Ambulatory Visit: Payer: Self-pay | Admitting: Orthopedic Surgery

## 2022-08-17 ENCOUNTER — Encounter: Payer: Self-pay | Admitting: Orthopedic Surgery

## 2022-08-17 ENCOUNTER — Telehealth: Payer: Self-pay | Admitting: Orthopedic Surgery

## 2022-08-17 NOTE — Telephone Encounter (Signed)
Patient left a message per voice mail relaying that his insurance does not cover medication:  celecoxib (CELEBREX) 100 MG capsule 60 capsule    - patient asking for Dr to please advise if any other/alternate is available that is comparable and covered?

## 2022-08-18 MED ORDER — CELECOXIB 100 MG PO CAPS: 100.0000 mg | ORAL_CAPSULE | Freq: Two times a day (BID) | ORAL | 2 refills | Status: DC

## 2022-09-21 ENCOUNTER — Encounter: Payer: Medicaid Other | Admitting: Orthopedic Surgery

## 2022-09-27 ENCOUNTER — Encounter: Payer: Medicaid Other | Admitting: Orthopedic Surgery

## 2022-10-25 ENCOUNTER — Encounter: Payer: Medicaid Other | Admitting: Orthopedic Surgery

## 2022-11-11 ENCOUNTER — Telehealth: Payer: Self-pay | Admitting: Orthopedic Surgery

## 2022-11-11 NOTE — Telephone Encounter (Signed)
[employer] is checking on the status of this patient's forms.  Ref # MRR [id number], [id number] OPT 3, [phone]

## 2022-12-16 ENCOUNTER — Encounter: Payer: Self-pay | Admitting: Radiology

## 2023-01-01 ENCOUNTER — Other Ambulatory Visit: Payer: Self-pay | Admitting: Orthopedic Surgery

## 2023-03-28 ENCOUNTER — Other Ambulatory Visit: Payer: Self-pay | Admitting: Orthopedic Surgery

## 2023-03-28 NOTE — Telephone Encounter (Signed)
Dr. Cathrine Muster pt - spoke w/the patient, he is requesting a refill on Celebrex

## 2023-03-28 NOTE — Telephone Encounter (Signed)
Dr. Cathrine Muster pt - spoke w/the patient, he is requesting a refill on Celebrex 100mg  to be sent to [facility] [phone]

## 2023-03-28 NOTE — Addendum Note (Signed)
Addended byCaffie Damme on: 03/28/2023 03:01 PM   Modules accepted: Orders

## 2023-03-28 NOTE — Telephone Encounter (Signed)
Received a vm from [facility] [phone] for this patient.  The person that called had a very thick accent and it was difficult to understand what she needed.  I have call and lvm to find out and I've also called the patient and lvm to see exactly what was needed so I could send a message on his behalf.

## 2023-04-02 ENCOUNTER — Other Ambulatory Visit: Payer: Self-pay | Admitting: Orthopedic Surgery

## 2023-04-10 ENCOUNTER — Other Ambulatory Visit: Payer: Self-pay | Admitting: Orthopedic Surgery

## 2023-04-11 ENCOUNTER — Other Ambulatory Visit: Payer: Self-pay

## 2023-04-11 NOTE — Telephone Encounter (Signed)
Dr. Dallas Schimke pt-------Patient's wife left message asking for his Celebrex TAKE 1 CAPSULE BY MOUTH TWICE A DAY   Patient uses  [facility] on [address] in [place], [place].

## 2023-04-14 ENCOUNTER — Telehealth: Payer: Self-pay | Admitting: Orthopedic Surgery

## 2023-04-14 NOTE — Telephone Encounter (Signed)
[facility]   Patient called and wants a refill on his medicine .  He is going to start doing it by mail order.  He needs a refill on the RX below one more time   celecoxib (CELEBREX) 100 MG capsule   Pharmacy:[facility] [id number] - [place], [place] - [address] AT [address]

## 2023-04-15 NOTE — Telephone Encounter (Signed)
Left VM lettting pt know it has been over a yr since we've seen him and that's why refills have not been sent. Recommended pt try to get medication from his PCP since he's trying to get 90 day annual refills.

## 2023-04-22 ENCOUNTER — Telehealth: Payer: Self-pay | Admitting: Orthopedic Surgery

## 2023-04-22 NOTE — Telephone Encounter (Signed)
LVM for the pharmacy that called following up on fax requests for refills, letting them know that Leta lvm for the pt advising it's been over a year since we've seen and and suggested he reach out to his PCP

## 2024-06-08 ENCOUNTER — Encounter: Payer: Self-pay | Admitting: Radiology

## 2024-08-20 ENCOUNTER — Encounter: Payer: Self-pay | Admitting: Radiology
